# Patient Record
Sex: Female | Born: 1995 | Race: White | Hispanic: No | Marital: Single | Smoking: Never smoker
Health system: Southern US, Community
[De-identification: ages and names within clinical notes are randomized; demographics above are authoritative.]

## PROBLEM LIST (undated history)

## (undated) DIAGNOSIS — R011 Cardiac murmur, unspecified: Secondary | ICD-10-CM

## (undated) DIAGNOSIS — K589 Irritable bowel syndrome without diarrhea: Secondary | ICD-10-CM

## (undated) DIAGNOSIS — K219 Gastro-esophageal reflux disease without esophagitis: Secondary | ICD-10-CM

## (undated) DIAGNOSIS — F909 Attention-deficit hyperactivity disorder, unspecified type: Secondary | ICD-10-CM

## (undated) DIAGNOSIS — K760 Fatty (change of) liver, not elsewhere classified: Secondary | ICD-10-CM

## (undated) HISTORY — DX: Irritable bowel syndrome without diarrhea: K58.9

## (undated) HISTORY — DX: Cardiac murmur, unspecified: R01.1

## (undated) HISTORY — DX: Attention-deficit hyperactivity disorder, unspecified type: F90.9

## (undated) HISTORY — PX: OTHER SURGICAL HISTORY: SHX169

---
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
## 2017-01-01 ENCOUNTER — Encounter: Payer: Self-pay | Admitting: Obstetrics and Gynecology

## 2017-01-05 ENCOUNTER — Encounter: Payer: 59 | Admitting: Obstetrics and Gynecology

## 2017-01-29 ENCOUNTER — Ambulatory Visit (INDEPENDENT_AMBULATORY_CARE_PROVIDER_SITE_OTHER): Payer: 59 | Admitting: Obstetrics and Gynecology

## 2017-01-29 ENCOUNTER — Encounter: Payer: Self-pay | Admitting: Obstetrics and Gynecology

## 2017-01-29 VITALS — BP 128/84 | HR 88 | Ht 64.0 in | Wt 198.6 lb

## 2017-01-29 DIAGNOSIS — Z01419 Encounter for gynecological examination (general) (routine) without abnormal findings: Secondary | ICD-10-CM

## 2017-01-29 DIAGNOSIS — K580 Irritable bowel syndrome with diarrhea: Secondary | ICD-10-CM | POA: Diagnosis not present

## 2017-01-29 DIAGNOSIS — Z23 Encounter for immunization: Secondary | ICD-10-CM

## 2017-01-29 MED ORDER — DICYCLOMINE HCL 10 MG PO CAPS: 10.0000 mg | ORAL_CAPSULE | Freq: Three times a day (TID) | ORAL | 4 refills | Status: DC

## 2017-01-29 MED ORDER — ETONOGESTREL-ETHINYL ESTRADIOL 0.12-0.015 MG/24HR VA RING: VAGINAL_RING | VAGINAL | 12 refills | Status: DC

## 2017-01-29 NOTE — Progress Notes (Signed)
  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 female and is here for a comprehensive physical exam. The patient reports considering other birth control options; has trouble remembering to take [facility] on time.  also has diarrhea after eating each meal for the last few months..  Social History   Socioeconomic History  . Marital status: Single    Spouse name: Not on file  . Number of children: Not on file  . Years of education: Not on file  . Highest education level: Not on file  Social Needs  . Financial resource strain: Not on file  . Food insecurity - worry: Not on file  . Food insecurity - inability: Not on file  . Transportation needs - medical: Not on file  . Transportation needs - non-medical: Not on file  Occupational History  . Not on file  Tobacco Use  . Smoking status: Never Smoker  . Smokeless tobacco: Never Used  Substance and Sexual Activity  . Alcohol use: Yes    Alcohol/week: 0.6 - 1.2 oz    Types: 1 - 2 Standard drinks or equivalent per week  . Drug use: No  . Sexual activity: Yes    Partners: Female    Birth control/protection: Condom, Pill  Other Topics Concern  . Not on file  Social History Narrative  . Not on file   Health Maintenance  Topic Date Due  . HIV Screening  05/31/2012  . TETANUS/TDAP  05/31/2016  . INFLUENZA VACCINE  10/01/2016  . CHLAMYDIA SCREENING  12/10/2016    The following portions of the patient's history were reviewed and updated as appropriate: allergies, current medications, past family history, past medical history, past social history, past surgical history and problem list.  Review of Systems A comprehensive review of systems was negative.   Objective:    General appearance: alert, cooperative and appears stated age Neck: no adenopathy, no carotid bruit, no JVD, supple, symmetrical, trachea midline and thyroid not enlarged, symmetric, no  tenderness/mass/nodules Lungs: clear to auscultation bilaterally Breasts: normal appearance, no masses or tenderness Heart: regular rate and rhythm, S1, S2 normal, no murmur, click, rub or gallop Abdomen: soft, non-tender; bowel sounds normal; no masses,  no organomegaly Pelvic: cervix normal in appearance, external genitalia normal, no adnexal masses or tenderness, no cervical motion tenderness, rectovaginal septum normal, uterus normal size, shape, and consistency and vagina normal without discharge    Assessment:    Healthy female exam. New onset IBS, switch BC to Nuvaring     Plan:  Referred to GI, started on Bentyl  Nuvaring counseling and info given, to place first ring as soon as hse gets prescription. RTC 1 year or as needed.  Malanie Koloski,CNM   See After Visit Summary for Counseling Recommendations

## 2017-01-29 NOTE — Patient Instructions (Addendum)
Preventive Care 18-39 Years, Female Preventive care refers to lifestyle choices and visits with your health care provider that can promote health and wellness. What does preventive care include?  A yearly physical exam. This is also called an annual well check.  Dental exams once or twice a year.  Routine eye exams. Ask your health care provider how often you should have your eyes checked.  Personal lifestyle choices, including: ? Daily care of your teeth and gums. ? Regular physical activity. ? Eating a healthy diet. ? Avoiding tobacco and drug use. ? Limiting alcohol use. ? Practicing safe sex. ? Taking vitamin and mineral supplements as recommended by your health care provider. What happens during an annual well check? The services and screenings done by your health care provider during your annual well check will depend on your age, overall health, lifestyle risk factors, and family history of disease. Counseling Your health care provider may ask you questions about your:  Alcohol use.  Tobacco use.  Drug use.  Emotional well-being.  Home and relationship well-being.  Sexual activity.  Eating habits.  Work and work Statistician.  Method of birth control.  Menstrual cycle.  Pregnancy history.  Screening You may have the following tests or measurements:  Height, weight, and BMI.  Diabetes screening. This is done by checking your blood sugar (glucose) after you have not eaten for a while (fasting).  Blood pressure.  Lipid and cholesterol levels. These may be checked every 5 years starting at age 38.  Skin check.  Hepatitis C blood test.  Hepatitis B blood test.  Sexually transmitted disease (STD) testing.  BRCA-related cancer screening. This may be done if you have a family history of breast, ovarian, tubal, or peritoneal cancers.  Pelvic exam and Pap test. This may be done every 3 years starting at age 38. Starting at age 30, this may be done  every 5 years if you have a Pap test in combination with an HPV test.  Discuss your test results, treatment options, and if necessary, the need for more tests with your health care provider. Vaccines Your health care provider may recommend certain vaccines, such as:  Influenza vaccine. This is recommended every year.  Tetanus, diphtheria, and acellular pertussis (Tdap, Td) vaccine. You may need a Td booster every 10 years.  Varicella vaccine. You may need this if you have not been vaccinated.  HPV vaccine. If you are 39 or younger, you may need three doses over 6 months.  Measles, mumps, and rubella (MMR) vaccine. You may need at least one dose of MMR. You may also need a second dose.  Pneumococcal 13-valent conjugate (PCV13) vaccine. You may need this if you have certain conditions and were not previously vaccinated.  Pneumococcal polysaccharide (PPSV23) vaccine. You may need one or two doses if you smoke cigarettes or if you have certain conditions.  Meningococcal vaccine. One dose is recommended if you are age 68-21 years and a first-year college student living in a residence hall, or if you have one of several medical conditions. You may also need additional booster doses.  Hepatitis A vaccine. You may need this if you have certain conditions or if you travel or work in places where you may be exposed to hepatitis A.  Hepatitis B vaccine. You may need this if you have certain conditions or if you travel or work in places where you may be exposed to hepatitis B.  Haemophilus influenzae type b (Hib) vaccine. You may need this  if you have certain risk factors.  Talk to your health care provider about which screenings and vaccines you need and how often you need them. This information is not intended to replace advice given to you by your health care provider. Make sure you discuss any questions you have with your health care provider. Document Released: 04/15/2001 Document Revised:  11/07/2015 Document Reviewed: 12/19/2014 Elsevier Interactive Patient Education  2017 [place]. Diet for Irritable Bowel Syndrome When you have irritable bowel syndrome (IBS), the foods you eat and your eating habits are very important. IBS may cause various symptoms, such as abdominal pain, constipation, or diarrhea. Choosing the right foods can help ease discomfort caused by these symptoms. Work with your health care provider and dietitian to find the best eating plan to help control your symptoms. What general guidelines do I need to follow?  Keep a food diary. This will help you identify foods that cause symptoms. Write down: ? What you eat and when. ? What symptoms you have. ? When symptoms occur in relation to your meals.  Avoid foods that cause symptoms. Talk with your dietitian about other ways to get the same nutrients that are in these foods.  Eat more foods that contain fiber. Take a fiber supplement if directed by your dietitian.  Eat your meals slowly, in a relaxed setting.  Aim to eat 5-6 small meals per day. Do not skip meals.  Drink enough fluids to keep your urine clear or pale yellow.  Ask your health care provider if you should take an over-the-counter probiotic during flare-ups to help restore healthy gut bacteria.  If you have cramping or diarrhea, try making your meals low in fat and high in carbohydrates. Examples of carbohydrates are pasta, rice, whole grain breads and cereals, fruits, and vegetables.  If dairy products cause your symptoms to flare up, try eating less of them. You might be able to handle yogurt better than other dairy products because it contains bacteria that help with digestion. What foods are not recommended? The following are some foods and drinks that may worsen your symptoms:  Fatty foods, such as [place] fries.  Milk products, such as cheese or ice cream.  Chocolate.  Alcohol.  Products with caffeine, such as  coffee.  Carbonated drinks, such as soda.  The items listed above may not be a complete list of foods and beverages to avoid. Contact your dietitian for more information. What foods are good sources of fiber? Your health care provider or dietitian may recommend that you eat more foods that contain fiber. Fiber can help reduce constipation and other IBS symptoms. Add foods with fiber to your diet a little at a time so that your body can get used to them. Too much fiber at once might cause gas and swelling of your abdomen. The following are some foods that are good sources of fiber:  Apples.  Peaches.  Pears.  Berries.  Figs.  Broccoli (raw).  Cabbage.  Carrots.  Raw peas.  Kidney beans.  Lima beans.  Whole grain bread.  Whole grain cereal.  Where to find more information: [employer] for Functional Gastrointestinal Disorders: www.iffgd.[employer] of Diabetes and Digestive and Kidney Diseases: NetworkAffair.co.za.aspx This information is not intended to replace advice given to you by your health care provider. Make sure you discuss any questions you have with your health care provider. Document Released: 05/10/2003 Document Revised: 07/26/2015 Document Reviewed: 05/20/2013 Elsevier Interactive Patient Education  2018 [employer].

## 2017-01-31 DIAGNOSIS — K589 Irritable bowel syndrome without diarrhea: Secondary | ICD-10-CM

## 2017-01-31 HISTORY — DX: Irritable bowel syndrome without diarrhea: K58.9

## 2017-01-31 LAB — GC/CHLAMYDIA PROBE AMP
CHLAMYDIA, DNA PROBE: NEGATIVE
Neisseria gonorrhoeae by PCR: NEGATIVE

## 2017-03-31 ENCOUNTER — Telehealth: Payer: Self-pay | Admitting: *Deleted

## 2017-03-31 NOTE — Telephone Encounter (Signed)
Spoke with pt

## 2017-03-31 NOTE — Telephone Encounter (Signed)
Patient called and states that she is on her third month of the nuva ring and she states she has not had cycle since she started the [facility]. Patient is wondering is that normal. Patient is requesting a call back . Her contact # is [phone]. Please advise. Thank you

## 2017-05-13 ENCOUNTER — Encounter: Payer: Self-pay | Admitting: Emergency Medicine

## 2017-05-13 ENCOUNTER — Other Ambulatory Visit: Payer: Self-pay

## 2017-05-13 ENCOUNTER — Ambulatory Visit
Admission: EM | Admit: 2017-05-13 | Discharge: 2017-05-13 | Disposition: A | Discharge status: Home / Self Care | Payer: 59 | Attending: Family Medicine | Admitting: Family Medicine

## 2017-05-13 DIAGNOSIS — R1013 Epigastric pain: Secondary | ICD-10-CM

## 2017-05-13 DIAGNOSIS — R1010 Upper abdominal pain, unspecified: Secondary | ICD-10-CM | POA: Diagnosis not present

## 2017-05-13 LAB — URINALYSIS, COMPLETE (UACMP) WITH MICROSCOPIC
BILIRUBIN URINE: NEGATIVE
Glucose, UA: NEGATIVE mg/dL
KETONES UR: NEGATIVE mg/dL
Nitrite: NEGATIVE
PH: 5 (ref 5.0–8.0)
Protein, ur: NEGATIVE mg/dL
SPECIFIC GRAVITY, URINE: 1.02 (ref 1.005–1.030)

## 2017-05-13 LAB — LIPASE, BLOOD: LIPASE: 22 U/L (ref 11–51)

## 2017-05-13 LAB — COMPREHENSIVE METABOLIC PANEL
ALT: 34 U/L (ref 14–54)
ANION GAP: 11 (ref 5–15)
AST: 30 U/L (ref 15–41)
Albumin: 3.8 g/dL (ref 3.5–5.0)
Alkaline Phosphatase: 62 U/L (ref 38–126)
BILIRUBIN TOTAL: 0.5 mg/dL (ref 0.3–1.2)
BUN: 13 mg/dL (ref 6–20)
CO2: 20 mmol/L — ABNORMAL LOW (ref 22–32)
Calcium: 9.1 mg/dL (ref 8.9–10.3)
Chloride: 104 mmol/L (ref 101–111)
Creatinine, Ser: 0.77 mg/dL (ref 0.44–1.00)
GFR calc non Af Amer: >60 mL/min (ref >60)
Glucose, Bld: 86 mg/dL (ref 65–99)
POTASSIUM: 3.5 mmol/L (ref 3.5–5.1)
Sodium: 135 mmol/L (ref 135–145)
TOTAL PROTEIN: 7.9 g/dL (ref 6.5–8.1)

## 2017-05-13 MED ORDER — GI COCKTAIL ~~LOC~~
30.0000 mL | Freq: Once | ORAL | Status: AC
  Administered 2017-05-13: 30 mL via ORAL

## 2017-05-13 NOTE — ED Triage Notes (Signed)
Patient in today c/o abdominal pain that radiates to her back x 2 months, worsened in the last week. Patient also states that she has pain when she takes in a deep breath. Patient states that food does make the pain worse. Patient denies fever.

## 2017-05-13 NOTE — ED Provider Notes (Signed)
[facility]    CSN: [id number] Arrival date & time: 05/13/17  1655     History   Chief Complaint Chief Complaint  Patient presents with  . Abdominal Pain    APPT    HPI Katrina Campos is a 22 y.o. female.   The history is provided by the patient.  Abdominal Pain  Pain location:  Epigastric and RUQ Pain quality: aching   Pain radiates to:  Back Pain severity:  Moderate Onset quality:  Sudden Duration:  6 weeks Timing:  Intermittent Progression:  Waxing and waning Chronicity:  New Context: not alcohol use, not awakening from sleep, not diet changes, not eating, not laxative use, not medication withdrawal, not previous surgeries, not recent illness, not recent sexual activity, not recent travel, not retching, not sick contacts, not suspicious food intake and not trauma   Relieved by:  None tried Worsened by:  Eating Ineffective treatments:  None tried Associated symptoms: belching and nausea   Associated symptoms: no anorexia, no chest pain, no chills, no constipation, no cough, no diarrhea, no dysuria, no fatigue, no fever, no flatus, no hematemesis, no hematochezia, no hematuria, no melena, no shortness of breath, no sore throat, no vaginal bleeding, no vaginal discharge and no vomiting   Risk factors: obesity   Risk factors: no alcohol abuse, no aspirin use, has not had multiple surgeries, no NSAID use, not pregnant and no recent hospitalization     Past Medical History:  Diagnosis Date  . Heart murmur     Patient Active Problem List   Diagnosis Date Noted  . Anxiety 06/08/2016  . Palpitations 04/28/2016  . Obesity 12/11/2015    Past Surgical History:  Procedure Laterality Date  . NO PAST SURGERIES    . tubes in ears      OB History    Gravida Para Term Preterm AB Living   0 0 0 0 0 0   SAB TAB Ectopic Multiple Live Births   0 0 0 0 0       Home Medications    Prior to Admission medications   Medication Sig Start Date End Date  Taking? Authorizing Provider  etonogestrel-ethinyl estradiol (NUVARING) 0.12-0.015 MG/24HR vaginal ring Insert vaginally and leave in place for 3 consecutive weeks, then remove for 1 week. 01/29/17  Yes Shambley, Melody N, CNM  dicyclomine (BENTYL) 10 MG capsule Take 1 capsule (10 mg total) by mouth 4 (four) times daily -  before meals and at bedtime. 01/29/17   Shambley, Melody N, CNM  TAYTULLA 1-20 MG-MCG(24) CAPS TAKE 1 CAPSULE BY MOUTH ONCE DAILY 09/29/16   Joylene Igo, CNM    Family History Family History  Problem Relation Age of Onset  . Diabetes Paternal Grandfather   . Hyperlipidemia Father   . Diabetes Father   . Heart disease Maternal Grandfather   . Sudden death Maternal Grandfather     Social History Social History   Tobacco Use  . Smoking status: Never Smoker  . Smokeless tobacco: Never Used  Substance Use Topics  . Alcohol use: Yes    Frequency: Never    Comment: socially  . Drug use: No     Allergies   Patient has no known allergies.   Review of Systems Review of Systems  Constitutional: Negative for chills, fatigue and fever.  HENT: Negative for sore throat.   Respiratory: Negative for cough and shortness of breath.   Cardiovascular: Negative for chest pain.  Gastrointestinal: Positive for abdominal pain  and nausea. Negative for anorexia, constipation, diarrhea, flatus, hematemesis, hematochezia, melena and vomiting.  Genitourinary: Negative for dysuria, hematuria, vaginal bleeding and vaginal discharge.     Physical Exam Triage Vital Signs ED Triage Vitals [05/13/17 1711]  Enc Vitals Group     BP 126/81     Pulse Rate 87     Resp 16     Temp 98 F (36.7 C)     Temp Source Oral     SpO2 100 %     Weight 195 lb (88.5 kg)     Height 5\' 4"  (1.626 m)     Head Circumference      Peak Flow      Pain Score 7     Pain Loc      Pain Edu?      Excl. in [place]?    No data found.  Updated Vital Signs BP 126/81 (BP Location: Left Arm)   Pulse  87   Temp 98 F (36.7 C) (Oral)   Resp 16   Ht 5\' 4"  (1.626 m)   Wt 195 lb (88.5 kg)   LMP 04/20/2017 (Approximate)   SpO2 100%   BMI 33.47 kg/m   Visual Acuity Right Eye Distance:   Left Eye Distance:   Bilateral Distance:    Right Eye Near:   Left Eye Near:    Bilateral Near:     Physical Exam  Constitutional: She appears well-developed and well-nourished. No distress.  HENT:  Head: Normocephalic and atraumatic.  Nose: No mucosal edema, rhinorrhea, nose lacerations, sinus tenderness, nasal deformity, septal deviation or nasal septal hematoma. No epistaxis.  No foreign bodies. Right sinus exhibits no maxillary sinus tenderness and no frontal sinus tenderness. Left sinus exhibits no maxillary sinus tenderness and no frontal sinus tenderness.  Mouth/Throat: Uvula is midline, oropharynx is clear and moist and mucous membranes are normal. No oropharyngeal exudate. No tonsillar exudate.  Eyes: Right eye exhibits no discharge. Left eye exhibits no discharge. No scleral icterus.  Neck: Normal range of motion. Neck supple. No thyromegaly present.  Cardiovascular: Normal rate, regular rhythm and normal heart sounds.  Pulmonary/Chest: Effort normal and breath sounds normal. No stridor. No respiratory distress. She has no wheezes. She has no rales.  Abdominal: Soft. Bowel sounds are normal. She exhibits no distension and no mass. There is no tenderness. There is no rebound and no guarding. No hernia.  Lymphadenopathy:    She has no cervical adenopathy.  Skin: She is not diaphoretic.  Nursing note and vitals reviewed.    UC Treatments / Results  Labs (all labs ordered are listed, but only abnormal results are displayed) Labs Reviewed  COMPREHENSIVE METABOLIC PANEL - Abnormal; Notable for the following components:      Result Value   CO2 20 (*)    All other components within normal limits  URINALYSIS, COMPLETE (UACMP) WITH MICROSCOPIC - Abnormal; Notable for the following  components:   Hgb urine dipstick MODERATE (*)    Leukocytes, UA MODERATE (*)    Squamous Epithelial / LPF 6-30 (*)    Bacteria, UA RARE (*)    All other components within normal limits  URINE CULTURE  LIPASE, BLOOD    EKG  EKG Interpretation None       Radiology No results found.  Procedures Procedures (including critical care time)  Medications Ordered in UC Medications  gi cocktail (Maalox,Lidocaine,Donnatal) (30 mLs Oral Given 05/13/17 1823)     Initial Impression / Assessment and Plan / UC  Course  I have reviewed the triage vital signs and the nursing notes.  Pertinent labs & imaging results that were available during my care of the patient were reviewed by me and considered in my medical decision making (see chart for details).       Final Clinical Impressions(s) / UC Diagnoses   Final diagnoses:  Dyspepsia  Pain of upper abdomen    ED Discharge Orders    None     1. Lab results and diagnosis reviewed with patient; given gi cocktail with improvement 2. Recommend supportive treatment with otc prilosec 3. Follow up with pcp 4. Follow-up prn if symptoms worsen or don't improve  Controlled Substance Prescriptions [place] Controlled Substance Registry consulted? Not Applicable   Norval Gable, MD 05/13/17 1950

## 2017-05-13 NOTE — ED Triage Notes (Signed)
Patient has been prescribed Bentyl to take prior to meals, but hasn't taken in the last month, due to it making her tired.

## 2017-05-13 NOTE — Discharge Instructions (Signed)
Prilosec or nexium once daily

## 2017-05-15 LAB — URINE CULTURE: Special Requests: NORMAL

## 2017-05-19 ENCOUNTER — Encounter: Payer: Self-pay | Admitting: Family Medicine

## 2017-05-19 ENCOUNTER — Ambulatory Visit: Payer: 59 | Admitting: Family Medicine

## 2017-05-19 VITALS — BP 128/78 | HR 78 | Ht 64.0 in | Wt 202.0 lb

## 2017-05-19 DIAGNOSIS — F419 Anxiety disorder, unspecified: Secondary | ICD-10-CM

## 2017-05-19 DIAGNOSIS — R1011 Right upper quadrant pain: Secondary | ICD-10-CM

## 2017-05-19 DIAGNOSIS — Z7689 Persons encountering health services in other specified circumstances: Secondary | ICD-10-CM | POA: Diagnosis not present

## 2017-05-19 DIAGNOSIS — F410 Panic disorder [episodic paroxysmal anxiety] without agoraphobia: Secondary | ICD-10-CM

## 2017-05-19 DIAGNOSIS — R1013 Epigastric pain: Secondary | ICD-10-CM | POA: Diagnosis not present

## 2017-05-19 MED ORDER — PANTOPRAZOLE SODIUM 40 MG PO TBEC: 40.0000 mg | DELAYED_RELEASE_TABLET | Freq: Every day | ORAL | 3 refills | Status: DC

## 2017-05-19 NOTE — Progress Notes (Signed)
Name: Katrina Campos   MRN: [id number]    DOB: 14-Dec-1995   Date:05/19/2017       Progress Note  Subjective  Chief Complaint  Chief Complaint  Patient presents with  . Establish Care  . Anxiety    Started in August of 2018. Getting worse. Knows we do not prescribe meds for anxiety. But wanted to discuss for possible referral. States when havign episodes she feels SOB, crying, and chest pain. Feels more like panic attacks. 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  . Abdominal Pain    Started last week. Seen UC. Was told by Dr Zenda Alpers to come up for PCP visit to get referral for GI doctor to have colonoscopy or endoscopy done. Says it could be stress related. Taking OTC Prilosec but feels like not helping much for this.     Patient present to establish care.   Anxiety  Presents for initial visit. Onset was 1 to 6 months ago (4 months). The problem has been unchanged ("not any better"). Symptoms include chest pain, decreased concentration, excessive worry, hyperventilation, irritability, nausea, nervous/anxious behavior, panic and shortness of breath. Patient reports no compulsions, confusion, depressed mood, dizziness, dry mouth, feeling of choking, impotence, insomnia, malaise, muscle tension, obsessions, palpitations, restlessness or suicidal ideas. Primary symptoms comment: cardiac evaluation. Symptoms occur most days. The severity of symptoms is moderate and interfering with daily activities. The symptoms are aggravated by work stress (and school). The quality of sleep is good.   There are no known risk factors.  Her past medical history is significant for arrhythmia. There is no history of anemia, CAD, CHF or hyperthyroidism. (Gyn trial IBS/bentyl) Past treatments include nothing. The treatment provided mild relief. Compliance with prior treatments has been good.  Abdominal Pain  This is a new problem. The current episode started more than 1 month  ago (6 wks). The onset quality is sudden. The problem occurs daily. The problem has been gradually worsening. The pain is located in the epigastric region (extends substerum). The pain is at a severity of 9/10. The pain is moderate. The quality of the pain is sharp and cramping (stabbing subcostal). The abdominal pain radiates to the back. Associated symptoms include belching, flatus and nausea. Pertinent negatives include no anorexia, arthralgias, constipation, diarrhea, dysuria, fever, frequency, headaches, hematochezia, hematuria, melena, myalgias, vomiting or weight loss. The pain is aggravated by eating and deep breathing. Relieved by: gi cocktail/slight prilosec. She has tried proton pump inhibitors for the symptoms. The treatment provided mild relief. There is no history of GERD, irritable bowel syndrome, PUD or ulcerative colitis. gyn trial IBS/bentyl    No problem-specific Assessment & Plan notes found for this encounter.   Past Medical History:  Diagnosis Date  . Heart murmur    Born with one  . IBS (irritable bowel syndrome) 01/2017    Past Surgical History:  Procedure Laterality Date  . tubes in ears      Family History  Problem Relation Age of Onset  . Diabetes Paternal Grandfather   . Hyperlipidemia Father   . Diabetes Father   . Heart disease Maternal Grandfather   . Sudden death Maternal Grandfather     Social History   Socioeconomic History  . Marital status: Single    Spouse name: Not on file  . Number of children: 0  . Years of education: Not on file  . Highest education level: Not on file  Social Needs  .  Financial resource strain: Not on file  . Food insecurity - worry: Not on file  . Food insecurity - inability: Not on file  . Transportation needs - medical: Not on file  . Transportation needs - non-medical: Not on file  Occupational History  . Not on file  Tobacco Use  . Smoking status: Never Smoker  . Smokeless tobacco: Never Used  Substance and  Sexual Activity  . Alcohol use: Yes    Frequency: Never    Comment: socially  . Drug use: No  . Sexual activity: Yes    Partners: Female    Birth control/protection: Condom, Pill  Other Topics Concern  . Not on file  Social History Narrative  . Not on file    No Known Allergies  Outpatient Medications Prior to Visit  Medication Sig Dispense Refill  . dicyclomine (BENTYL) 10 MG capsule Take 10 mg by mouth as needed (Makes sleepy but takes as needed for IBS.).    Marland Kitchen etonogestrel-ethinyl estradiol (NUVARING) 0.12-0.015 MG/24HR vaginal ring Insert vaginally and leave in place for 3 consecutive weeks, then remove for 1 week. 1 each 12  . omeprazole (PRILOSEC) 10 MG capsule Take 10 mg by mouth daily.    Marland Kitchen dicyclomine (BENTYL) 10 MG capsule Take 1 capsule (10 mg total) by mouth 4 (four) times daily -  before meals and at bedtime. (Patient not taking: Reported on 05/19/2017) 90 capsule 4  . TAYTULLA 1-20 MG-MCG(24) CAPS TAKE 1 CAPSULE BY MOUTH ONCE DAILY (Patient not taking: Reported on 05/19/2017) 28 capsule 4   No facility-administered medications prior to visit.     Review of Systems  Constitutional: Positive for irritability. Negative for chills, diaphoresis, fever, malaise/fatigue and weight loss.  HENT: Negative for ear discharge, ear pain and sore throat.   Eyes: Negative for blurred vision.  Respiratory: Positive for shortness of breath. Negative for cough, sputum production and wheezing.   Cardiovascular: Positive for chest pain. Negative for palpitations and leg swelling.  Gastrointestinal: Positive for abdominal pain, flatus and nausea. Negative for anorexia, blood in stool, constipation, diarrhea, heartburn, hematochezia, melena and vomiting.  Genitourinary: Negative for dysuria, frequency, hematuria, impotence and urgency.  Musculoskeletal: Positive for back pain. Negative for arthralgias, joint pain, myalgias and neck pain.  Skin: Negative for itching and rash.  Neurological:  Negative for dizziness, tingling, sensory change, focal weakness, weakness and headaches.  Endo/Heme/Allergies: Negative for environmental allergies and polydipsia. Does not bruise/bleed easily.  Psychiatric/Behavioral: Positive for decreased concentration. Negative for confusion, depression and suicidal ideas. The patient is nervous/anxious. The patient does not have insomnia.      Objective  Vitals:   05/19/17 1520  BP: 128/78  Pulse: 78  SpO2: 99%  Weight: 202 lb (91.6 kg)  Height: 5\' 4"  (1.626 m)    Physical Exam  Constitutional: She is well-developed, well-nourished, and in no distress. No distress.  HENT:  Head: Normocephalic and atraumatic.  Right Ear: External ear normal.  Left Ear: External ear normal.  Nose: Nose normal.  Mouth/Throat: Oropharynx is clear and moist.  Eyes: Conjunctivae and EOM are normal. Pupils are equal, round, and reactive to light. Right eye exhibits no discharge. Left eye exhibits no discharge.  Neck: Normal range of motion. Neck supple. No JVD present. No thyromegaly present.  Cardiovascular: Normal rate, regular rhythm, normal heart sounds and intact distal pulses. Exam reveals no gallop and no friction rub.  No murmur heard. Pulmonary/Chest: Effort normal and breath sounds normal. She has no wheezes. She has  no rales. She exhibits bony tenderness.  Abdominal: Soft. Bowel sounds are normal. She exhibits no distension and no mass. There is no hepatosplenomegaly. There is tenderness in the right upper quadrant and epigastric area. There is guarding. There is no rigidity and no rebound.  Musculoskeletal: Normal range of motion. She exhibits no edema.  Lymphadenopathy:    She has no cervical adenopathy.  Neurological: She is alert. She has normal reflexes.  Skin: Skin is warm and dry. She is not diaphoretic.  Psychiatric: Mood and affect normal.  Nursing note and vitals reviewed.     Assessment & Plan  Problem List Items Addressed This Visit       Other   Anxiety   Relevant Orders   Ambulatory referral to Psychiatry    Other Visit Diagnoses    Establishing care with new doctor, encounter for    -  Primary   Dyspepsia       Relevant Medications   pantoprazole (PROTONIX) 40 MG tablet   Other Relevant Orders   Ambulatory referral to Gastroenterology   Right upper quadrant abdominal pain       Relevant Orders   Ambulatory referral to Gastroenterology   US Abdomen Limited RUQ   Panic attacks       Relevant Orders   Ambulatory referral to Psychiatry      Meds ordered this encounter  Medications  . pantoprazole (PROTONIX) 40 MG tablet    Sig: Take 1 tablet (40 mg total) by mouth daily.    Dispense:  30 tablet    Refill:  3      Dr. Macon [facility]  05/19/17

## 2017-05-25 ENCOUNTER — Ambulatory Visit
Admission: RE | Admit: 2017-05-25 | Discharge: 2017-05-25 | Disposition: A | Discharge status: Home / Self Care | Payer: 59 | Source: Ambulatory Visit | Attending: Family Medicine | Admitting: Family Medicine

## 2017-05-25 DIAGNOSIS — R1011 Right upper quadrant pain: Secondary | ICD-10-CM | POA: Insufficient documentation

## 2017-05-25 DIAGNOSIS — K760 Fatty (change of) liver, not elsewhere classified: Secondary | ICD-10-CM | POA: Diagnosis not present

## 2017-05-29 ENCOUNTER — Other Ambulatory Visit: Payer: 59

## 2017-05-29 DIAGNOSIS — K760 Fatty (change of) liver, not elsewhere classified: Secondary | ICD-10-CM | POA: Diagnosis not present

## 2017-05-30 LAB — LIPID PANEL WITH LDL/HDL RATIO
CHOLESTEROL TOTAL: 167 mg/dL (ref 100–169)
HDL: 37 mg/dL — ABNORMAL LOW (ref >39)
LDL Calculated: 120 mg/dL — ABNORMAL HIGH (ref 0–109)
LDl/HDL Ratio: 3.2 ratio (ref 0.0–3.2)
Triglycerides: 50 mg/dL (ref 0–89)
VLDL CHOLESTEROL CAL: 10 mg/dL (ref 5–40)

## 2017-05-30 LAB — HEPATIC FUNCTION PANEL
ALT: 99 IU/L — AB (ref 0–32)
AST: 81 IU/L — AB (ref 0–40)
Albumin: 4.4 g/dL (ref 3.5–5.5)
Alkaline Phosphatase: 66 IU/L (ref 39–117)
BILIRUBIN, DIRECT: 0.14 mg/dL (ref 0.00–0.40)
Bilirubin Total: 0.5 mg/dL (ref 0.0–1.2)
Total Protein: 7.6 g/dL (ref 6.0–8.5)

## 2017-06-01 ENCOUNTER — Other Ambulatory Visit: Payer: Self-pay

## 2017-06-09 ENCOUNTER — Encounter: Payer: Self-pay | Admitting: Gastroenterology

## 2017-06-09 ENCOUNTER — Ambulatory Visit: Payer: 59 | Admitting: Gastroenterology

## 2017-06-09 VITALS — BP 114/65 | HR 71 | Temp 98.5°F | Resp 16 | Ht 64.0 in | Wt 194.2 lb

## 2017-06-09 DIAGNOSIS — R7989 Other specified abnormal findings of blood chemistry: Secondary | ICD-10-CM

## 2017-06-09 DIAGNOSIS — E6609 Other obesity due to excess calories: Secondary | ICD-10-CM | POA: Diagnosis not present

## 2017-06-09 DIAGNOSIS — Z6833 Body mass index (BMI) 33.0-33.9, adult: Secondary | ICD-10-CM | POA: Diagnosis not present

## 2017-06-09 DIAGNOSIS — R945 Abnormal results of liver function studies: Secondary | ICD-10-CM | POA: Diagnosis not present

## 2017-06-09 DIAGNOSIS — K760 Fatty (change of) liver, not elsewhere classified: Secondary | ICD-10-CM

## 2017-06-09 NOTE — Progress Notes (Signed)
Cephas Darby, MD [address]  [place]  [place], [place] [address]  Main: [phone]  Fax: [phone]    Gastroenterology Consultation  Referring Provider:     Juline Patch, MD Primary Care Physician:  Juline Patch, MD Primary Gastroenterologist:  Dr. Cephas Darby Reason for Consultation:     Elevated LFTs, and upper abdominal pain        HPI:   Katrina Campos is a 22 y.o. female referred by Dr. Juline Patch, MD  for consultation & management of and chronic upper abdominal pain and new onset of elevated LFTs. Patient reports approximately 3 months history of epigastric pain, associated with nausea, right upper quadrant pain, constant and no relation to food. She underwent ultrasound abdomen which revealed fatty liver, here was no evidence of cholelithiasis, normal caliber CBD. She was also found to have mildly elevated transaminases 2-3x ULN on 05/29/2017. Her last normal LFTs were on 05/13/2017. Patient has history of severe anxiety and stress which is currently under control and she thinks her symptoms are mostly related to it. Patient reports that her right upper quadrant and upper abdominal pain has subsided. She has gained weight recently, TSH is normal. She also reports history of palpitations secondary to anxiety and underwent echocardiogram and stress echocardiogram which were unremarkable. 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 She otherwise denies practicing unprotected sex, IV drug abuse, blood transfusions, recent sickness. She reports that she is immunized against hepatitis A and B. No known HIV. She does reports that she consumes diet soda, red meat, white bread regularly  She denies using herbal supplements, excess Tylenol use or NSAID use  NSAIDs: none  Antiplts/Anticoagulants/Anti thrombotics: none  GI Procedures: none She denies family history of liver disease or GI malignancy  Past Medical  History:  Diagnosis Date  . Heart murmur    Born with one  . IBS (irritable bowel syndrome) 01/2017    Past Surgical History:  Procedure Laterality Date  . tubes in ears       Current Outpatient Medications:  .  dicyclomine (BENTYL) 10 MG capsule, Take 10 mg by mouth as needed (Makes sleepy but takes as needed for IBS.)., Disp: , Rfl:  .  etonogestrel-ethinyl estradiol (NUVARING) 0.12-0.015 MG/24HR vaginal ring, Insert vaginally and leave in place for 3 consecutive weeks, then remove for 1 week., Disp: 1 each, Rfl: 12 .  pantoprazole (PROTONIX) 40 MG tablet, Take 1 tablet (40 mg total) by mouth daily., Disp: 30 tablet, Rfl: 3   Family History  Problem Relation Age of Onset  . Diabetes Paternal Grandfather   . Hyperlipidemia Father   . Diabetes Father   . Heart disease Maternal Grandfather   . Sudden death Maternal Grandfather      Social History   Tobacco Use  . Smoking status: Never Smoker  . Smokeless tobacco: Never Used  Substance Use Topics  . Alcohol use: Yes    Frequency: Never    Comment: socially  . Drug use: No    Allergies as of 06/09/2017  . (No Known Allergies)    Review of Systems:    All systems reviewed and negative except where noted in HPI.   Physical Exam:  BP 114/65   Pulse 71   Temp 98.5 F (36.9 C) (Oral)   Resp 16   Ht 5\' 4"  (1.626 m)   Wt 194 lb 3.2 oz (88.1 kg)  LMP 05/26/2017   SpO2 97%   BMI 33.33 kg/m  Patient's last menstrual period was 05/26/2017.  General:   Alert,  Well-developed, well-nourished, pleasant and cooperative in NAD Head:  Normocephalic and atraumatic. Eyes:  Sclera clear, no icterus.   Conjunctiva pink. Ears:  Normal auditory acuity. Nose:  No deformity, discharge, or lesions. Mouth:  No deformity or lesions,oropharynx pink & moist. Neck:  Supple; no masses or thyromegaly. Lungs:  Respirations even and unlabored.  Clear throughout to auscultation.   No wheezes, crackles, or rhonchi. No acute  distress. Heart:  Regular rate and rhythm; no murmurs, clicks, rubs, or gallops. Abdomen:  Normal bowel sounds. Soft, obese, mild epigastric tenderness, and non-distended without masses, hepatosplenomegaly or hernias noted.  No guarding or rebound tenderness.   Rectal: Not performed Msk:  Symmetrical without gross deformities. Good, equal movement & strength bilaterally. Pulses:  Normal pulses noted. Extremities:  No clubbing or edema.  No cyanosis. Neurologic:  Alert and oriented x3;  grossly normal neurologically. Skin:  Intact without significant lesions or rashes. No jaundice. Lymph Nodes:  No significant cervical adenopathy. Psych:  Alert and cooperative. Normal mood and affect.  Imaging Studies: reviewed  Assessment and Plan:   Katrina Campos is a 22 y.o. female with obesity, generalized anxiety is seen in consultation for epigastric pain, right upper quadrant pain and elevated LFTs  Epigastric/right upper quadrant pain: RUQ pain currently resolved On Protonix for epigastric pain Defer EGD at this time  Elevated LFTs, predominantly transaminases 2-3 x ULN - probably secondary to fatty liver - discussed with her about weight loss by dietary modification and exercise - recheck LFTs in 3 months, if persistently elevated recommend secondary liver disease workup   Follow up in 3 months   Cephas Darby, MD

## 2017-09-01 ENCOUNTER — Encounter: Payer: Self-pay | Admitting: Obstetrics and Gynecology

## 2017-09-11 ENCOUNTER — Encounter: Payer: Self-pay | Admitting: Gastroenterology

## 2017-09-11 ENCOUNTER — Ambulatory Visit: Payer: 59 | Admitting: Gastroenterology

## 2017-09-11 VITALS — BP 107/73 | HR 72 | Resp 17 | Ht 64.0 in | Wt 185.0 lb

## 2017-09-11 DIAGNOSIS — K30 Functional dyspepsia: Secondary | ICD-10-CM | POA: Diagnosis not present

## 2017-09-11 DIAGNOSIS — R945 Abnormal results of liver function studies: Secondary | ICD-10-CM | POA: Diagnosis not present

## 2017-09-11 DIAGNOSIS — R7989 Other specified abnormal findings of blood chemistry: Secondary | ICD-10-CM

## 2017-09-11 DIAGNOSIS — K760 Fatty (change of) liver, not elsewhere classified: Secondary | ICD-10-CM | POA: Diagnosis not present

## 2017-09-11 NOTE — Progress Notes (Signed)
Cephas Darby, MD [address]  [place]  [place], [place] [address]  Main: [phone]  Fax: [phone]    Gastroenterology Consultation  Referring Provider:     Juline Patch, MD Primary Care Physician:  Juline Patch, MD Primary Gastroenterologist:  Dr. Cephas Darby Reason for Consultation:     Elevated LFTs, and upper abdominal pain        HPI:   Katrina Campos is a 22 y.o. female referred by Dr. Juline Patch, MD  for consultation & management of and chronic upper abdominal pain and new onset of elevated LFTs. Patient reports approximately 3 months history of epigastric pain, associated with nausea, right upper quadrant pain, constant and no relation to food. She underwent ultrasound abdomen which revealed fatty liver, here was no evidence of cholelithiasis, normal caliber CBD. She was also found to have mildly elevated transaminases 2-3x ULN on 05/29/2017. Her last normal LFTs were on 05/13/2017. Patient has history of severe anxiety and stress which is currently under control and she thinks her symptoms are mostly related to it. Patient reports that her right upper quadrant and upper abdominal pain has subsided. She has gained weight recently, TSH is normal. She also reports history of palpitations secondary to anxiety and underwent echocardiogram and stress echocardiogram which were unremarkable. 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 She otherwise denies practicing unprotected sex, IV drug abuse, blood transfusions, recent sickness. She reports that she is immunized against hepatitis A and B. No known HIV. She does reports that she consumes diet soda, red meat, white bread regularly  She denies using herbal supplements, excess Tylenol use or NSAID use  Follow-up visit 09/11/2017 She lost about 9 pounds since last visit by following healthy diet and exercise. She is not consuming red meat, fast foods. She denies any  GI symptoms that she was experiencing before. She feels well overall. She is taking Bentyl as needed and Protonix daily  NSAIDs: none  Antiplts/Anticoagulants/Anti thrombotics: none  GI Procedures: none She denies family history of liver disease or GI malignancy  Past Medical History:  Diagnosis Date  . Heart murmur    Born with one  . IBS (irritable bowel syndrome) 01/2017    Past Surgical History:  Procedure Laterality Date  . tubes in ears       Current Outpatient Medications:  .  dicyclomine (BENTYL) 10 MG capsule, Take 10 mg by mouth as needed (Makes sleepy but takes as needed for IBS.)., Disp: , Rfl:  .  etonogestrel-ethinyl estradiol (NUVARING) 0.12-0.015 MG/24HR vaginal ring, Insert vaginally and leave in place for 3 consecutive weeks, then remove for 1 week., Disp: 1 each, Rfl: 12 .  pantoprazole (PROTONIX) 40 MG tablet, Take 1 tablet (40 mg total) by mouth daily., Disp: 30 tablet, Rfl: 3   Family History  Problem Relation Age of Onset  . Diabetes Paternal Grandfather   . Hyperlipidemia Father   . Diabetes Father   . Heart disease Maternal Grandfather   . Sudden death Maternal Grandfather      Social History   Tobacco Use  . Smoking status: Never Smoker  . Smokeless tobacco: Never Used  Substance Use Topics  . Alcohol use: Yes    Frequency: Never    Comment: socially  . Drug use: No    Allergies as of 09/11/2017  . (No Known Allergies)    Review of Systems:  All systems reviewed and negative except where noted in HPI.   Physical Exam:  BP 107/73 (BP Location: Left Arm, Patient Position: Sitting, Cuff Size: Large)   Pulse 72   Resp 17   Ht 5\' 4"  (1.626 m)   Wt 185 lb (83.9 kg)   BMI 31.76 kg/m  No LMP recorded.  General:   Alert,  Well-developed, well-nourished, pleasant and cooperative in NAD Head:  Normocephalic and atraumatic. Eyes:  Sclera clear, no icterus.   Conjunctiva pink. Ears:  Normal auditory acuity. Nose:  No deformity,  discharge, or lesions. Mouth:  No deformity or lesions,oropharynx pink & moist. Neck:  Supple; no masses or thyromegaly. Lungs:  Respirations even and unlabored.  Clear throughout to auscultation.   No wheezes, crackles, or rhonchi. No acute distress. Heart:  Regular rate and rhythm; no murmurs, clicks, rubs, or gallops. Abdomen:  Normal bowel sounds. Soft, nontender, and non-distended without masses, hepatosplenomegaly or hernias noted.  No guarding or rebound tenderness.   Rectal: Not performed Msk:  Symmetrical without gross deformities. Good, equal movement & strength bilaterally. Pulses:  Normal pulses noted. Extremities:  No clubbing or edema.  No cyanosis. Neurologic:  Alert and oriented x3;  grossly normal neurologically. Skin:  Intact without significant lesions or rashes. No jaundice. Psych:  Alert and cooperative. Normal mood and affect.  Imaging Studies: reviewed  Assessment and Plan:   Katrina Campos is a 22 y.o. female with obesity, generalized anxiety is seen for follow-up of epigastric pain, right upper quadrant pain and elevated LFTs. She is currently asymptomatic  Epigastric/right upper quadrant pain: RUQ pain currently resolved On Protonix for epigastric pain Defer EGD at this time  Elevated LFTs, predominantly transaminases 2-3 x ULN - probably secondary to fatty liver - lost about 9 pounds by following healthy lifestyle and exercise - recheck LFTs today, if persistently elevated recommend secondary liver disease workup   Follow up in 6 months   Cephas Darby, MD

## 2017-09-24 ENCOUNTER — Encounter: Payer: Self-pay | Admitting: Obstetrics and Gynecology

## 2017-10-01 ENCOUNTER — Encounter: Payer: Self-pay | Admitting: Obstetrics and Gynecology

## 2017-10-15 ENCOUNTER — Encounter: Payer: Self-pay | Admitting: Family Medicine

## 2017-10-15 ENCOUNTER — Ambulatory Visit: Payer: 59 | Admitting: Family Medicine

## 2017-10-15 VITALS — BP 118/79 | HR 102 | Temp 101.7°F | Resp 16 | Ht 64.0 in | Wt 184.4 lb

## 2017-10-15 DIAGNOSIS — J029 Acute pharyngitis, unspecified: Secondary | ICD-10-CM

## 2017-10-15 DIAGNOSIS — J0301 Acute recurrent streptococcal tonsillitis: Secondary | ICD-10-CM | POA: Diagnosis not present

## 2017-10-15 LAB — POCT RAPID STREP A (OFFICE): Rapid Strep A Screen: POSITIVE — AB

## 2017-10-15 MED ORDER — AMOXICILLIN-POT CLAVULANATE 875-125 MG PO TABS: 1.0000 | ORAL_TABLET | Freq: Two times a day (BID) | ORAL | 0 refills | Status: DC

## 2017-10-15 MED ORDER — PREDNISONE 10 MG PO TABS: 10.0000 mg | ORAL_TABLET | Freq: Every day | ORAL | 0 refills | Status: DC

## 2017-10-15 MED ORDER — TRAMADOL HCL 50 MG PO TABS: 50.0000 mg | ORAL_TABLET | Freq: Four times a day (QID) | ORAL | 0 refills | Status: AC

## 2017-10-15 MED ORDER — FLUCONAZOLE 150 MG PO TABS: 150.0000 mg | ORAL_TABLET | Freq: Once | ORAL | 0 refills | Status: AC

## 2017-10-15 NOTE — Progress Notes (Signed)
Name: Katrina Campos   MRN: [id number]    DOB: [birth year]   Date:10/15/2017       Progress Note  Subjective  Chief Complaint  Chief Complaint  Patient presents with  . Sore Throat    tonsils swollen and fever x 1 day with severe pain upon swallowing     Sore Throat   This is a new problem. The current episode started yesterday. The problem has been gradually worsening. The pain is worse on the right side. There has been no fever. The pain is severe. Associated symptoms include drooling, ear pain, a hoarse voice and swollen glands. Pertinent negatives include no abdominal pain, congestion, coughing, diarrhea, ear discharge, headaches, plugged ear sensation, neck pain, shortness of breath, stridor, trouble swallowing or vomiting. She has tried acetaminophen for the symptoms.    No problem-specific Assessment & Plan notes found for this encounter.   Past Medical History:  Diagnosis Date  . Heart murmur    Born with one  . IBS (irritable bowel syndrome) 01/2017    Past Surgical History:  Procedure Laterality Date  . tubes in ears      Family History  Problem Relation Age of Onset  . Diabetes Paternal Grandfather   . Hyperlipidemia Father   . Diabetes Father   . Heart disease Maternal Grandfather   . Sudden death Maternal Grandfather     Social History   Socioeconomic History  . Marital status: Single    Spouse name: Not on file  . Number of children: 0  . Years of education: Not on file  . Highest education level: Not on file  Occupational History  . Not on file  Social Needs  . Financial resource strain: Not on file  . Food insecurity:    Worry: Not on file    Inability: Not on file  . Transportation needs:    Medical: Not on file    Non-medical: Not on file  Tobacco Use  . Smoking status: Never Smoker  . Smokeless tobacco: Never Used  Substance and Sexual Activity  . Alcohol use: Yes    Frequency: Never    Comment: socially  . Drug use: No  . Sexual  activity: Yes    Partners: Female    Birth control/protection: Condom, Pill  Lifestyle  . Physical activity:    Days per week: Not on file    Minutes per session: Not on file  . Stress: Not on file  Relationships  . Social connections:    Talks on phone: Not on file    Gets together: Not on file    Attends religious service: Not on file    Active member of club or organization: Not on file    Attends meetings of clubs or organizations: Not on file    Relationship status: Not on file  . Intimate partner violence:    Fear of current or ex partner: Not on file    Emotionally abused: Not on file    Physically abused: Not on file    Forced sexual activity: Not on file  Other Topics Concern  . Not on file  Social History Narrative  . Not on file    No Known Allergies  Outpatient Medications Prior to Visit  Medication Sig Dispense Refill  . dicyclomine (BENTYL) 10 MG capsule Take 10 mg by mouth as needed (Makes sleepy but takes as needed for IBS.).    Marland Kitchen etonogestrel-ethinyl estradiol (NUVARING) 0.12-0.015 MG/24HR vaginal ring Insert vaginally and  leave in place for 3 consecutive weeks, then remove for 1 week. 1 each 12  . ibuprofen (ADVIL,MOTRIN) 200 MG tablet Take 200 mg by mouth every 4 (four) hours.    . pantoprazole (PROTONIX) 40 MG tablet Take 1 tablet (40 mg total) by mouth daily. 30 tablet 3   No facility-administered medications prior to visit.     Review of Systems  Constitutional: Negative for chills, fever, malaise/fatigue and weight loss.  HENT: Positive for drooling, ear pain and hoarse voice. Negative for congestion, ear discharge, sore throat and trouble swallowing.   Eyes: Negative for blurred vision.  Respiratory: Negative for cough, sputum production, shortness of breath, wheezing and stridor.   Cardiovascular: Negative for chest pain, palpitations and leg swelling.  Gastrointestinal: Negative for abdominal pain, blood in stool, constipation, diarrhea,  heartburn, melena, nausea and vomiting.  Genitourinary: Negative for dysuria, frequency, hematuria and urgency.  Musculoskeletal: Negative for back pain, joint pain, myalgias and neck pain.  Skin: Negative for rash.  Neurological: Negative for dizziness, tingling, sensory change, focal weakness and headaches.  Endo/Heme/Allergies: Negative for environmental allergies and polydipsia. Does not bruise/bleed easily.  Psychiatric/Behavioral: Negative for depression and suicidal ideas. The patient is not nervous/anxious and does not have insomnia.      Objective  Vitals:   10/15/17 1546  BP: 118/79  Pulse: (!) 102  Resp: 16  Temp: (!) 101.7 F (38.7 C)  TempSrc: Oral  SpO2: 100%  Weight: 184 lb 6.4 oz (83.6 kg)  Height: 5\' 4"  (1.626 m)    Physical Exam  Constitutional: She is oriented to person, place, and time. She appears well-developed and well-nourished.  HENT:  Head: Normocephalic.  Right Ear: Hearing, tympanic membrane, external ear and ear canal normal.  Left Ear: Hearing, tympanic membrane, external ear and ear canal normal.  Nose: Nose normal.  Mouth/Throat: Uvula is midline and mucous membranes are normal. Mucous membranes are not cyanotic. No oral lesions. No trismus in the jaw. No uvula swelling. Oropharyngeal exudate, posterior oropharyngeal edema and posterior oropharyngeal erythema present. No tonsillar abscesses. Tonsils are 2+ on the right. Tonsils are 2+ on the left. Tonsillar exudate.  Eyes: Pupils are equal, round, and reactive to light. Conjunctivae and EOM are normal. Lids are everted and swept, no foreign bodies found. Left eye exhibits no hordeolum. No foreign body present in the left eye. Right conjunctiva is not injected. Left conjunctiva is not injected. No scleral icterus.  Neck: Normal range of motion. Neck supple. Normal carotid pulses, no hepatojugular reflux and no JVD present. No tracheal tenderness present. Carotid bruit is not present. No tracheal  deviation and no edema present. No thyroid mass and no thyromegaly present.  Cardiovascular: Normal rate, regular rhythm, normal heart sounds and intact distal pulses. Exam reveals no gallop and no friction rub.  No murmur heard. Pulmonary/Chest: Effort normal and breath sounds normal. No respiratory distress. She has no wheezes. She has no rales. She exhibits tenderness.  Abdominal: Soft. Bowel sounds are normal. She exhibits no mass. There is no hepatosplenomegaly. There is no tenderness. There is no rebound and no guarding.  Musculoskeletal: Normal range of motion. She exhibits no edema or tenderness.  Lymphadenopathy:       Head (right side): Submandibular adenopathy present.       Head (left side): Submandibular adenopathy present.    She has no cervical adenopathy.  Neurological: She is alert and oriented to person, place, and time. She has normal strength. She displays normal reflexes. No cranial  nerve deficit.  Skin: Skin is warm. No rash noted.  Psychiatric: She has a normal mood and affect. Her mood appears not anxious. She does not exhibit a depressed mood.  Nursing note and vitals reviewed.     Assessment & Plan  Problem List Items Addressed This Visit    None    Visit Diagnoses    Sore throat    -  Primary   Simular to March/strep throat/ check rapid strep.   Relevant Orders   POCT rapid strep A (Completed)   Ambulatory referral to ENT   Recurrent streptococcal tonsillitis       Acute Recurrent Will treat augmentin 875 bid 10 days.prednisone 10 mg daily/ diflucan 150mg  prn/ tramadol 50mg  for pain. Referral ent /consider tonsilectomy.   Relevant Medications   amoxicillin-clavulanate (AUGMENTIN) 875-125 MG tablet   fluconazole (DIFLUCAN) 150 MG tablet   Other Relevant Orders   Ambulatory referral to ENT      Meds ordered this encounter  Medications  . amoxicillin-clavulanate (AUGMENTIN) 875-125 MG tablet    Sig: Take 1 tablet by mouth 2 (two) times daily.     Dispense:  20 tablet    Refill:  0  . predniSONE (DELTASONE) 10 MG tablet    Sig: Take 1 tablet (10 mg total) by mouth daily with breakfast.    Dispense:  30 tablet    Refill:  0  . fluconazole (DIFLUCAN) 150 MG tablet    Sig: Take 1 tablet (150 mg total) by mouth once for 1 dose.    Dispense:  1 tablet    Refill:  0  . traMADol (ULTRAM) 50 MG tablet    Sig: Take 1 tablet (50 mg total) by mouth 4 (four) times daily for 3 days.    Dispense:  12 tablet    Refill:  0      Dr. Macon [facility]  10/15/17

## 2017-10-28 ENCOUNTER — Ambulatory Visit: Payer: 59

## 2017-11-03 ENCOUNTER — Ambulatory Visit (INDEPENDENT_AMBULATORY_CARE_PROVIDER_SITE_OTHER): Payer: 59 | Admitting: Family Medicine

## 2017-11-03 VITALS — BP 120/80 | HR 72 | Ht 64.0 in | Wt 185.0 lb

## 2017-11-03 DIAGNOSIS — Z23 Encounter for immunization: Secondary | ICD-10-CM | POA: Diagnosis not present

## 2017-11-03 DIAGNOSIS — D2272 Melanocytic nevi of left lower limb, including hip: Secondary | ICD-10-CM

## 2017-11-03 DIAGNOSIS — J0391 Acute recurrent tonsillitis, unspecified: Secondary | ICD-10-CM | POA: Diagnosis not present

## 2017-11-03 DIAGNOSIS — A184 Tuberculosis of skin and subcutaneous tissue: Secondary | ICD-10-CM

## 2017-11-03 NOTE — Progress Notes (Signed)
Name: Katrina Campos   MRN: [id number]    DOB: 1995/08/01   Date:11/03/2017       Progress Note  Subjective  Chief Complaint  Chief Complaint  Patient presents with  . PPD Reading    get tb skin test placed    HPI  No problem-specific Assessment & Plan notes found for this encounter.   Past Medical History:  Diagnosis Date  . Heart murmur    Born with one  . IBS (irritable bowel syndrome) 01/2017    Past Surgical History:  Procedure Laterality Date  . tubes in ears      Family History  Problem Relation Age of Onset  . Diabetes Paternal Grandfather   . Hyperlipidemia Father   . Diabetes Father   . Heart disease Maternal Grandfather   . Sudden death Maternal Grandfather     Social History   Socioeconomic History  . Marital status: Single    Spouse name: Not on file  . Number of children: 0  . Years of education: Not on file  . Highest education level: Not on file  Occupational History  . Not on file  Social Needs  . Financial resource strain: Not on file  . Food insecurity:    Worry: Not on file    Inability: Not on file  . Transportation needs:    Medical: Not on file    Non-medical: Not on file  Tobacco Use  . Smoking status: Never Smoker  . Smokeless tobacco: Never Used  Substance and Sexual Activity  . Alcohol use: Yes    Frequency: Never    Comment: socially  . Drug use: No  . Sexual activity: Yes    Partners: Female    Birth control/protection: Condom, Pill  Lifestyle  . Physical activity:    Days per week: Not on file    Minutes per session: Not on file  . Stress: Not on file  Relationships  . Social connections:    Talks on phone: Not on file    Gets together: Not on file    Attends religious service: Not on file    Active member of club or organization: Not on file    Attends meetings of clubs or organizations: Not on file    Relationship status: Not on file  . Intimate partner violence:    Fear of current or ex partner: Not on  file    Emotionally abused: Not on file    Physically abused: Not on file    Forced sexual activity: Not on file  Other Topics Concern  . Not on file  Social History Narrative  . Not on file    No Known Allergies  Outpatient Medications Prior to Visit  Medication Sig Dispense Refill  . dicyclomine (BENTYL) 10 MG capsule Take 10 mg by mouth as needed (Makes sleepy but takes as needed for IBS.).    Marland Kitchen etonogestrel-ethinyl estradiol (NUVARING) 0.12-0.015 MG/24HR vaginal ring Insert vaginally and leave in place for 3 consecutive weeks, then remove for 1 week. 1 each 12  . ibuprofen (ADVIL,MOTRIN) 200 MG tablet Take 200 mg by mouth every 4 (four) hours.    . pantoprazole (PROTONIX) 40 MG tablet Take 1 tablet (40 mg total) by mouth daily. 30 tablet 3  . amoxicillin-clavulanate (AUGMENTIN) 875-125 MG tablet Take 1 tablet by mouth 2 (two) times daily. 20 tablet 0  . predniSONE (DELTASONE) 10 MG tablet Take 1 tablet (10 mg total) by mouth daily with breakfast. 30  tablet 0   No facility-administered medications prior to visit.     Review of Systems  Constitutional: Negative for chills, fever, malaise/fatigue and weight loss.  HENT: Negative for ear discharge, ear pain and sore throat.   Eyes: Negative for blurred vision.  Respiratory: Negative for cough, sputum production, shortness of breath and wheezing.   Cardiovascular: Negative for chest pain, palpitations and leg swelling.  Gastrointestinal: Negative for abdominal pain, blood in stool, constipation, diarrhea, heartburn, melena and nausea.  Genitourinary: Negative for dysuria, frequency, hematuria and urgency.  Musculoskeletal: Negative for back pain, joint pain, myalgias and neck pain.  Skin: Negative for rash.  Neurological: Negative for dizziness, tingling, sensory change, focal weakness and headaches.  Endo/Heme/Allergies: Negative for environmental allergies and polydipsia. Does not bruise/bleed easily.  Psychiatric/Behavioral:  Negative for depression and suicidal ideas. The patient is not nervous/anxious and does not have insomnia.      Objective  Vitals:   11/03/17 1457  BP: 120/80  Pulse: 72  Weight: 185 lb (83.9 kg)  Height: 5\' 4"  (1.626 m)    Physical Exam  Constitutional: She is oriented to person, place, and time. She appears well-developed and well-nourished.  HENT:  Head: Normocephalic.  Right Ear: External ear normal.  Left Ear: External ear normal.  Mouth/Throat: Oropharynx is clear and moist.  Eyes: Pupils are equal, round, and reactive to light. Conjunctivae and EOM are normal. Lids are everted and swept, no foreign bodies found. Left eye exhibits no hordeolum. No foreign body present in the left eye. Right conjunctiva is not injected. Left conjunctiva is not injected. No scleral icterus.  Neck: Normal range of motion. Neck supple. No JVD present. No tracheal deviation present. No thyromegaly present.  Cardiovascular: Normal rate, regular rhythm, normal heart sounds and intact distal pulses. Exam reveals no gallop and no friction rub.  No murmur heard. Pulmonary/Chest: Effort normal and breath sounds normal. No respiratory distress. She has no wheezes. She has no rales.  Abdominal: Soft. Bowel sounds are normal. She exhibits no mass. There is no hepatosplenomegaly. There is no tenderness. There is no rebound and no guarding.  Musculoskeletal: Normal range of motion. She exhibits no edema or tenderness.  Lymphadenopathy:    She has no cervical adenopathy.  Neurological: She is alert and oriented to person, place, and time. She has normal strength. She displays normal reflexes. No cranial nerve deficit.  Skin: Skin is warm. No rash noted.  Maculopapular pigmented nevus  Psychiatric: She has a normal mood and affect. Her mood appears not anxious. She does not exhibit a depressed mood.  Nursing note and vitals reviewed.     Assessment & Plan  Problem List Items Addressed This Visit    None     Visit Diagnoses    Recurrent tonsillitis    -  Primary   check on referral from 10/15/17- proceed   Relevant Orders   Ambulatory referral to ENT   Melanocytic nevus of left lower extremity       referral to dermatology   Relevant Orders   Ambulatory referral to Dermatology   Need for diphtheria-tetanus-pertussis (Tdap) vaccine       TDAP given   Relevant Orders   Tdap vaccine greater than or equal to 7yo IM (Completed)   TB skin/subcutaneous       tb administered   Relevant Orders   TB Skin Test (Completed)      No orders of the defined types were placed in this encounter.  Dr. [facility]  11/03/17

## 2017-11-05 LAB — TB SKIN TEST
Induration: 0 mm
TB Skin Test: NEGATIVE

## 2017-11-12 DIAGNOSIS — J3501 Chronic tonsillitis: Secondary | ICD-10-CM | POA: Diagnosis not present

## 2017-11-12 DIAGNOSIS — J351 Hypertrophy of tonsils: Secondary | ICD-10-CM | POA: Diagnosis not present

## 2017-11-17 ENCOUNTER — Ambulatory Visit (INDEPENDENT_AMBULATORY_CARE_PROVIDER_SITE_OTHER): Payer: 59

## 2017-11-17 DIAGNOSIS — A184 Tuberculosis of skin and subcutaneous tissue: Secondary | ICD-10-CM

## 2017-11-23 LAB — TB SKIN TEST
INDURATION: 0 mm
TB SKIN TEST: NEGATIVE

## 2017-11-24 DIAGNOSIS — D485 Neoplasm of uncertain behavior of skin: Secondary | ICD-10-CM | POA: Diagnosis not present

## 2017-11-24 DIAGNOSIS — L700 Acne vulgaris: Secondary | ICD-10-CM | POA: Diagnosis not present

## 2017-11-24 DIAGNOSIS — D2272 Melanocytic nevi of left lower limb, including hip: Secondary | ICD-10-CM | POA: Diagnosis not present

## 2017-11-24 DIAGNOSIS — Z79899 Other long term (current) drug therapy: Secondary | ICD-10-CM | POA: Diagnosis not present

## 2017-11-26 DIAGNOSIS — L700 Acne vulgaris: Secondary | ICD-10-CM | POA: Diagnosis not present

## 2017-12-07 ENCOUNTER — Ambulatory Visit (INDEPENDENT_AMBULATORY_CARE_PROVIDER_SITE_OTHER): Payer: 59

## 2017-12-07 DIAGNOSIS — Z23 Encounter for immunization: Secondary | ICD-10-CM

## 2017-12-07 DIAGNOSIS — D485 Neoplasm of uncertain behavior of skin: Secondary | ICD-10-CM | POA: Diagnosis not present

## 2017-12-24 DIAGNOSIS — L700 Acne vulgaris: Secondary | ICD-10-CM | POA: Diagnosis not present

## 2017-12-24 DIAGNOSIS — Z79899 Other long term (current) drug therapy: Secondary | ICD-10-CM | POA: Diagnosis not present

## 2018-01-11 DIAGNOSIS — J3501 Chronic tonsillitis: Secondary | ICD-10-CM | POA: Diagnosis not present

## 2018-01-11 DIAGNOSIS — J351 Hypertrophy of tonsils: Secondary | ICD-10-CM | POA: Diagnosis not present

## 2018-01-14 ENCOUNTER — Other Ambulatory Visit: Payer: Self-pay

## 2018-01-14 ENCOUNTER — Encounter: Payer: Self-pay | Admitting: *Deleted

## 2018-01-14 NOTE — Discharge Instructions (Signed)
T & A INSTRUCTION SHEET - [facility] °[facility] ° °CREIGHTON VAUGHT, MD °PAUL H. JUENGEL, MD  °P. SCOTT BENNETT °CHAPMAN MCQUEEN, MD ° °[address] [place], [place] [address] TEL. [phone] °[address] [place] [address] [phone] ° °INFORMATION SHEET FOR A TONSILLECTOMY AND ADENDOIDECTOMY ° °About Your Tonsils and Adenoids ° The tonsils and adenoids are normal body tissues that are part of our immune system.  They normally help to protect us against diseases that may enter our mouth and nose.  However, sometimes the tonsils and/or adenoids become too large and obstruct our breathing, especially at night. °  ° If either of these things happen it helps to remove the tonsils and adenoids in order to become healthier. The operation to remove the tonsils and adenoids is called a tonsillectomy and adenoidectomy. ° °The Location of Your Tonsils and Adenoids ° The tonsils are located in the back of the throat on both side and sit in a cradle of muscles. The adenoids are located in the roof of the mouth, behind the nose, and closely associated with the opening of the Eustachian tube to the ear. ° °Surgery on Tonsils and Adenoids ° A tonsillectomy and adenoidectomy is a short operation which takes about thirty minutes.  This includes being put to sleep and being awakened.  Tonsillectomies and adenoidectomies are performed at [facility] and may require observation period in the recovery room prior to going home. ° °Following the Operation for a Tonsillectomy ° A cautery machine is used to control bleeding.  Bleeding from a tonsillectomy and adenoidectomy is minimal and postoperatively the risk of bleeding is approximately four percent, although this rarely life threatening. ° ° ° °After your tonsillectomy and adenoidectomy post-op care at home: ° °1. Our patients are able to go home the same day.  You may be given prescriptions for pain  medications and antibiotics, if indicated. °2. It is extremely important to remember that fluid intake is of utmost importance after a tonsillectomy.  The amount that you drink must be maintained in the postoperative period.  A good indication of whether a child is getting enough fluid is whether his/her urine output is constant.  As long as children are urinating or wetting their diaper every 6 - 8 hours this is usually enough fluid intake.   °3. Although rare, this is a risk of some bleeding in the first ten days after surgery.  This is usually occurs between day five and nine postoperatively.  This risk of bleeding is approximately four percent.  If you or your child should have any bleeding you should remain calm and notify our office or go directly to the Emergency Room at [facility] where they will contact us. Our doctors are available seven days a week for notification.  We recommend sitting up quietly in a chair, place an ice pack on the front of the neck and spitting out the blood gently until we are able to contact you.  Adults should gargle gently with ice water and this may help stop the bleeding.  If the bleeding does not stop after a short time, i.e. 10 to 15 minutes, or seems to be increasing again, please contact us or go to the hospital.   °4. It is common for the pain to be worse at 5 - 7 days postoperatively.  This occurs because the “scab” is peeling off and the mucous membrane (skin of   the throat) is growing back where the tonsils were.   5. It is common for a low-grade fever, less than 102, during the first week after a tonsillectomy and adenoidectomy.  It is usually due to not drinking enough liquids, and we suggest your use liquid Tylenol or the pain medicine with Tylenol prescribed in order to keep your temperature below 102.  Please follow the directions on the back of the bottle. 6. Do not take aspirin or any products that contain aspirin such as Bufferin, Anacin,  Ecotrin, aspirin gum, Goodies, BC headache powders, etc., after a T&A because it can promote bleeding.  Please check with our office before administering any other medication that may been prescribed by other doctors during the two week post-operative period. 7. If you happen to look in the mirror or into your childs mouth you will see white/gray patches on the back of the throat.  This is what a scab looks like in the mouth and is normal after having a T&A.  It will disappear once the tonsil area heals completely. However, it may cause a noticeable odor, and this too will disappear with time.     8. You or your child may experience ear pain after having a T&A.  This is called referred pain and comes from the throat, but it is felt in the ears.  Ear pain is quite common and expected.  It will usually go away after ten days.  There is usually nothing wrong with the ears, and it is primarily due to the healing area stimulating the nerve to the ear that runs along the side of the throat.  Use either the prescribed pain medicine or Tylenol as needed.  9. The throat tissues after a tonsillectomy are obviously sensitive.  Smoking around children who have had a tonsillectomy significantly increases the risk of bleeding.  DO NOT SMOKE!   General Anesthesia, Adult, Care After These instructions provide you with information about caring for yourself after your procedure. Your health care provider may also give you more specific instructions. Your treatment has been planned according to current medical practices, but problems sometimes occur. Call your health care provider if you have any problems or questions after your procedure. What can I expect after the procedure? After the procedure, it is common to have:  Vomiting.  A sore throat.  Mental slowness.  It is common to feel:  Nauseous.  Cold or shivery.  Sleepy.  Tired.  Sore or achy, even in parts of your body where you did not have  surgery.  Follow these instructions at home: For at least 24 hours after the procedure:  Do not: ? Participate in activities where you could fall or become injured. ? Drive. ? Use heavy machinery. ? Drink alcohol. ? Take sleeping pills or medicines that cause drowsiness. ? Make important decisions or sign legal documents. ? Take care of children on your own.  Rest. Eating and drinking  If you vomit, drink water, juice, or soup when you can drink without vomiting.  Drink enough fluid to keep your urine clear or pale yellow.  Make sure you have little or no nausea before eating solid foods.  Follow the diet recommended by your health care provider. General instructions  Have a responsible adult stay with you until you are awake and alert.  Return to your normal activities as told by your health care provider. Ask your health care provider what activities are safe for you.  Take over-the-counter and  prescription medicines only as told by your health care provider.  If you smoke, do not smoke without supervision.  Keep all follow-up visits as told by your health care provider. This is important. Contact a health care provider if:  You continue to have nausea or vomiting at home, and medicines are not helpful.  You cannot drink fluids or start eating again.  You cannot urinate after 8-12 hours.  You develop a skin rash.  You have fever.  You have increasing redness at the site of your procedure. Get help right away if:  You have difficulty breathing.  You have chest pain.  You have unexpected bleeding.  You feel that you are having a life-threatening or urgent problem. This information is not intended to replace advice given to you by your health care provider. Make sure you discuss any questions you have with your health care provider. Document Released: 05/26/2000 Document Revised: 07/23/2015 Document Reviewed: 02/01/2015 Elsevier Interactive Patient Education   [employer].

## 2018-01-21 ENCOUNTER — Ambulatory Visit: Payer: 59 | Admitting: Anesthesiology

## 2018-01-21 ENCOUNTER — Ambulatory Visit
Admission: RE | Admit: 2018-01-21 | Discharge: 2018-01-21 | Disposition: A | Discharge status: Home / Self Care | Payer: 59 | Source: Ambulatory Visit | Attending: Otolaryngology | Admitting: Otolaryngology

## 2018-01-21 ENCOUNTER — Encounter
Admission: RE | Disposition: A | Discharge status: Home / Self Care | Payer: Self-pay | Source: Ambulatory Visit | Attending: Otolaryngology

## 2018-01-21 DIAGNOSIS — J3501 Chronic tonsillitis: Secondary | ICD-10-CM | POA: Diagnosis not present

## 2018-01-21 DIAGNOSIS — J358 Other chronic diseases of tonsils and adenoids: Secondary | ICD-10-CM | POA: Diagnosis not present

## 2018-01-21 DIAGNOSIS — J351 Hypertrophy of tonsils: Secondary | ICD-10-CM | POA: Diagnosis not present

## 2018-01-21 HISTORY — DX: Fatty (change of) liver, not elsewhere classified: K76.0

## 2018-01-21 HISTORY — PX: TONSILLECTOMY: SHX5217

## 2018-01-21 HISTORY — DX: Gastro-esophageal reflux disease without esophagitis: K21.9

## 2018-01-21 SURGERY — TONSILLECTOMY
Anesthesia: General | Site: Throat

## 2018-01-21 MED ORDER — ACETAMINOPHEN 10 MG/ML IV SOLN
1000.0000 mg | Freq: Once | INTRAVENOUS | Status: AC
  Administered 2018-01-21: 1000 mg via INTRAVENOUS

## 2018-01-21 MED ORDER — PROMETHAZINE HCL 25 MG/ML IJ SOLN: 6.2500 mg | INTRAMUSCULAR | Status: DC | PRN

## 2018-01-21 MED ORDER — FENTANYL CITRATE (PF) 100 MCG/2ML IJ SOLN
INTRAMUSCULAR | Status: DC | PRN
  Administered 2018-01-21: 100 ug via INTRAVENOUS
  Administered 2018-01-21: 12.5 ug via INTRAVENOUS
  Administered 2018-01-21: 25 ug via INTRAVENOUS
  Administered 2018-01-21: 12.5 ug via INTRAVENOUS

## 2018-01-21 MED ORDER — ACETAMINOPHEN 325 MG PO TABS: 325.0000 mg | ORAL_TABLET | ORAL | Status: DC | PRN

## 2018-01-21 MED ORDER — SUCCINYLCHOLINE CHLORIDE 20 MG/ML IJ SOLN
INTRAMUSCULAR | Status: DC | PRN
  Administered 2018-01-21: 80 mg via INTRAVENOUS

## 2018-01-21 MED ORDER — LIDOCAINE HCL (CARDIAC) PF 100 MG/5ML IV SOSY
PREFILLED_SYRINGE | INTRAVENOUS | Status: DC | PRN
  Administered 2018-01-21: 40 mg via INTRAVENOUS

## 2018-01-21 MED ORDER — OXYCODONE HCL 5 MG/5ML PO SOLN
5.0000 mg | Freq: Once | ORAL | Status: AC | PRN
  Administered 2018-01-21: 5 mg via ORAL

## 2018-01-21 MED ORDER — ONDANSETRON HCL 4 MG/2ML IJ SOLN
INTRAMUSCULAR | Status: DC | PRN
  Administered 2018-01-21: 4 mg via INTRAVENOUS

## 2018-01-21 MED ORDER — GLYCOPYRROLATE 0.2 MG/ML IJ SOLN
INTRAMUSCULAR | Status: DC | PRN
  Administered 2018-01-21: .1 mg via INTRAVENOUS

## 2018-01-21 MED ORDER — SCOPOLAMINE 1 MG/3DAYS TD PT72
1.0000 | MEDICATED_PATCH | Freq: Once | TRANSDERMAL | Status: DC
  Administered 2018-01-21: 1.5 mg via TRANSDERMAL

## 2018-01-21 MED ORDER — DEXAMETHASONE SODIUM PHOSPHATE 4 MG/ML IJ SOLN
INTRAMUSCULAR | Status: DC | PRN
  Administered 2018-01-21: 8 mg via INTRAVENOUS

## 2018-01-21 MED ORDER — ACETAMINOPHEN 160 MG/5ML PO SOLN: 325.0000 mg | ORAL | Status: DC | PRN

## 2018-01-21 MED ORDER — OXYCODONE HCL 5 MG PO TABS: 5.0000 mg | ORAL_TABLET | Freq: Once | ORAL | Status: AC | PRN

## 2018-01-21 MED ORDER — SILVER NITRATE-POT NITRATE 75-25 % EX MISC
CUTANEOUS | Status: DC | PRN
  Administered 2018-01-21: 2

## 2018-01-21 MED ORDER — FENTANYL CITRATE (PF) 100 MCG/2ML IJ SOLN: 25.0000 ug | INTRAMUSCULAR | Status: DC | PRN

## 2018-01-21 MED ORDER — HYDROCODONE-ACETAMINOPHEN 7.5-325 MG/15ML PO SOLN: 15.0000 mL | Freq: Four times a day (QID) | ORAL | 0 refills | Status: AC | PRN

## 2018-01-21 MED ORDER — LACTATED RINGERS IV SOLN
10.0000 mL/h | INTRAVENOUS | Status: DC
  Administered 2018-01-21: 10 mL/h via INTRAVENOUS

## 2018-01-21 MED ORDER — PROPOFOL 10 MG/ML IV BOLUS
INTRAVENOUS | Status: DC | PRN
  Administered 2018-01-21: 150 mg via INTRAVENOUS
  Administered 2018-01-21: 30 mg via INTRAVENOUS

## 2018-01-21 MED ORDER — MIDAZOLAM HCL 5 MG/5ML IJ SOLN
INTRAMUSCULAR | Status: DC | PRN
  Administered 2018-01-21 (×2): 2 mg via INTRAVENOUS

## 2018-01-21 SURGICAL SUPPLY — 13 items
BLADE BOVIE TIP EXT 4 (BLADE) ×3 IMPLANT
CANISTER SUCT 1200ML W/VALVE (MISCELLANEOUS) ×3 IMPLANT
ELECT REM PT RETURN 9FT ADLT (ELECTROSURGICAL) ×3
ELECTRODE REM PT RTRN 9FT ADLT (ELECTROSURGICAL) ×1 IMPLANT
GLOVE PI ULTRA LF STRL 7.5 (GLOVE) ×1 IMPLANT
GLOVE PI ULTRA NON LATEX 7.5 (GLOVE) ×2
KIT TURNOVER KIT A (KITS) ×3 IMPLANT
PACK TONSIL/ADENOIDS (PACKS) ×3 IMPLANT
PENCIL SMOKE EVACUATOR (MISCELLANEOUS) ×3 IMPLANT
SLEEVE SUCTION 125 (MISCELLANEOUS) ×3 IMPLANT
SOL ANTI-FOG 6CC FOG-OUT (MISCELLANEOUS) ×1 IMPLANT
SOL FOG-OUT ANTI-FOG 6CC (MISCELLANEOUS) ×2
STRAP BODY AND KNEE 60X3 (MISCELLANEOUS) ×3 IMPLANT

## 2018-01-21 NOTE — Transfer of Care (Signed)
Immediate Anesthesia Transfer of Care Note  Patient: Katrina Campos  Procedure(s) Performed: TONSILLECTOMY (N/A Throat)  Patient Location: PACU  Anesthesia Type: General  Level of Consciousness: awake, alert  and patient cooperative  Airway and Oxygen Therapy: Patient Spontanous Breathing and Patient connected to supplemental oxygen  Post-op Assessment: Post-op Vital signs reviewed, Patient's Cardiovascular Status Stable, Respiratory Function Stable, Patent Airway and No signs of Nausea or vomiting  Post-op Vital Signs: Reviewed and stable  Complications: No apparent anesthesia complications

## 2018-01-21 NOTE — Anesthesia Procedure Notes (Signed)
Procedure Name: Intubation Date/Time: 01/21/2018 8:43 AM Performed by: Mayme Genta, CRNA Pre-anesthesia Checklist: Patient identified, Emergency Drugs available, Suction available, Patient being monitored and Timeout performed Patient Re-evaluated:Patient Re-evaluated prior to induction Oxygen Delivery Method: Circle system utilized Preoxygenation: Pre-oxygenation with 100% oxygen Induction Type: IV induction Ventilation: Mask ventilation without difficulty Laryngoscope Size: Miller and 2 Grade View: Grade I Tube type: Oral Rae Tube size: 7.0 mm Number of attempts: 1 Placement Confirmation: ETT inserted through vocal cords under direct vision,  positive ETCO2 and breath sounds checked- equal and bilateral Tube secured with: Tape Dental Injury: Teeth and Oropharynx as per pre-operative assessment

## 2018-01-21 NOTE — H&P (Signed)
H&P has been reviewed and patient reevaluated, no changes necessary. To be downloaded later.  

## 2018-01-21 NOTE — Anesthesia Postprocedure Evaluation (Signed)
Anesthesia Post Note  Patient: Katrina Campos  Procedure(s) Performed: TONSILLECTOMY (N/A Throat)  Patient location during evaluation: PACU Anesthesia Type: General Level of consciousness: awake Pain management: pain level controlled Vital Signs Assessment: post-procedure vital signs reviewed and stable Respiratory status: respiratory function stable Cardiovascular status: stable Postop Assessment: no signs of nausea or vomiting Anesthetic complications: no    Veda Canning

## 2018-01-21 NOTE — Anesthesia Preprocedure Evaluation (Signed)
Anesthesia Evaluation  Patient identified by MRN, date of birth, ID band Patient awake    Reviewed: Allergy & Precautions, NPO status , Patient's Chart, lab work & pertinent test results  Airway Mallampati: II  TM Distance: >3 FB     Dental   Pulmonary neg pulmonary ROS, neg recent URI,    breath sounds clear to auscultation       Cardiovascular Exercise Tolerance: Good negative cardio ROS   Rhythm:Regular Rate:Normal     Neuro/Psych    GI/Hepatic GERD  ,IBS   Endo/Other  BMI 32  Renal/GU      Musculoskeletal   Abdominal   Peds  Hematology   Anesthesia Other Findings   Reproductive/Obstetrics                             Anesthesia Physical Anesthesia Plan  ASA: II  Anesthesia Plan: General   Post-op Pain Management:    Induction: Intravenous  PONV Risk Score and Plan: 3 and Ondansetron, Dexamethasone and Scopolamine patch - Pre-op  Airway Management Planned: Oral ETT  Additional Equipment:   Intra-op Plan:   Post-operative Plan:   Informed Consent: I have reviewed the patients History and Physical, chart, labs and discussed the procedure including the risks, benefits and alternatives for the proposed anesthesia with the patient or authorized representative who has indicated his/her understanding and acceptance.   Dental advisory given  Plan Discussed with: CRNA  Anesthesia Plan Comments:         Anesthesia Quick Evaluation

## 2018-01-21 NOTE — Op Note (Signed)
01/21/2018  9:11 AM    Campos, Katrina Fuse  [id number]   Pre-Op Dx: Chronic tonsillitis and tonsillar lithiasis  Post-op Dx: Same  Proc: Tonsillectomy  Surg:  Katrina Campos  Anes:  GOT  EBL: 20 mL  Comp: None  Findings: The tonsils were cryptic and chronically inflamed.  The left tonsil was worse than the right.  Adenoids were not enlarged.  Procedure: The patient was brought to the operating room and placed in spine position.  She was given general anesthesia by oral endotracheal intubation.  When she was asleep a Shaune Pascal was used to visualize the oropharynx tonsils are fairly large and cryptic with lots of debris in him, especially the left side.  The soft palate was retracted to visualize the adenoids and these were not enlarged.  The tonsils were grasped and pulled medially.  The anterior pillar incised with electrocautery.  The tonsil was dissected from its fossa using blunt dissection and electrocautery.  Bleeding was controlled with direct pressure and electrocautery.  I had to use more cautery on her left side because of the inflammation and scarring to the tonsil bed.  This was completed on both sides.  The patient tolerated the procedure well.  She was awakened taken to the recovery room in satisfactory condition.  There were no operative complications.  Dispo:   To PACU to be discharged home.  Plan: To follow-up in the office in a couple weeks to reevaluate.  She will push liquids at home and have soft foods as tolerated.  Will use some Tylenol with hydrocodone liquid for pain and supplement this with Tylenol at home.  Katrina Campos  01/21/2018 9:11 AM

## 2018-01-22 ENCOUNTER — Encounter: Payer: Self-pay | Admitting: Otolaryngology

## 2018-01-25 DIAGNOSIS — Z79899 Other long term (current) drug therapy: Secondary | ICD-10-CM | POA: Diagnosis not present

## 2018-01-25 DIAGNOSIS — L700 Acne vulgaris: Secondary | ICD-10-CM | POA: Diagnosis not present

## 2018-01-25 LAB — SURGICAL PATHOLOGY

## 2018-02-09 ENCOUNTER — Ambulatory Visit: Payer: 59 | Admitting: Gastroenterology

## 2018-02-12 ENCOUNTER — Encounter: Payer: Self-pay | Admitting: Obstetrics and Gynecology

## 2018-02-12 ENCOUNTER — Ambulatory Visit (INDEPENDENT_AMBULATORY_CARE_PROVIDER_SITE_OTHER): Payer: 59 | Admitting: Obstetrics and Gynecology

## 2018-02-12 ENCOUNTER — Other Ambulatory Visit (HOSPITAL_COMMUNITY)
Admission: RE | Admit: 2018-02-12 | Discharge: 2018-02-12 | Disposition: A | Discharge status: Home / Self Care | Payer: 59 | Source: Ambulatory Visit | Attending: Obstetrics and Gynecology | Admitting: Obstetrics and Gynecology

## 2018-02-12 VITALS — BP 102/66 | HR 77 | Ht 63.0 in | Wt 184.4 lb

## 2018-02-12 DIAGNOSIS — Z01419 Encounter for gynecological examination (general) (routine) without abnormal findings: Secondary | ICD-10-CM | POA: Diagnosis not present

## 2018-02-12 MED ORDER — ETONOGESTREL-ETHINYL ESTRADIOL 0.12-0.015 MG/24HR VA RING: VAGINAL_RING | VAGINAL | 12 refills | Status: DC

## 2018-02-12 NOTE — Patient Instructions (Signed)
Preventive Care 18-39 Years, Female Preventive care refers to lifestyle choices and visits with your health care provider that can promote health and wellness. What does preventive care include?  A yearly physical exam. This is also called an annual well check.  Dental exams once or twice a year.  Routine eye exams. Ask your health care provider how often you should have your eyes checked.  Personal lifestyle choices, including: ? Daily care of your teeth and gums. ? Regular physical activity. ? Eating a healthy diet. ? Avoiding tobacco and drug use. ? Limiting alcohol use. ? Practicing safe sex. ? Taking vitamin and mineral supplements as recommended by your health care provider. What happens during an annual well check? The services and screenings done by your health care provider during your annual well check will depend on your age, overall health, lifestyle risk factors, and family history of disease. Counseling Your health care provider may ask you questions about your:  Alcohol use.  Tobacco use.  Drug use.  Emotional well-being.  Home and relationship well-being.  Sexual activity.  Eating habits.  Work and work Statistician.  Method of birth control.  Menstrual cycle.  Pregnancy history.  Screening You may have the following tests or measurements:  Height, weight, and BMI.  Diabetes screening. This is done by checking your blood sugar (glucose) after you have not eaten for a while (fasting).  Blood pressure.  Lipid and cholesterol levels. These may be checked every 5 years starting at age 38.  Skin check.  Hepatitis C blood test.  Hepatitis B blood test.  Sexually transmitted disease (STD) testing.  BRCA-related cancer screening. This may be done if you have a family history of breast, ovarian, tubal, or peritoneal cancers.  Pelvic exam and Pap test. This may be done every 3 years starting at age 20. Starting at age 30, this may be done  every 5 years if you have a Pap test in combination with an HPV test.  Discuss your test results, treatment options, and if necessary, the need for more tests with your health care provider. Vaccines Your health care provider may recommend certain vaccines, such as:  Influenza vaccine. This is recommended every year.  Tetanus, diphtheria, and acellular pertussis (Tdap, Td) vaccine. You may need a Td booster every 10 years.  Varicella vaccine. You may need this if you have not been vaccinated.  HPV vaccine. If you are 20 or younger, you may need three doses over 6 months.  Measles, mumps, and rubella (MMR) vaccine. You may need at least one dose of MMR. You may also need a second dose.  Pneumococcal 13-valent conjugate (PCV13) vaccine. You may need this if you have certain conditions and were not previously vaccinated.  Pneumococcal polysaccharide (PPSV23) vaccine. You may need one or two doses if you smoke cigarettes or if you have certain conditions.  Meningococcal vaccine. One dose is recommended if you are age 20-20 years and a first-year college student living in a residence hall, or if you have one of several medical conditions. You may also need additional booster doses.  Hepatitis A vaccine. You may need this if you have certain conditions or if you travel or work in places where you may be exposed to hepatitis A.  Hepatitis B vaccine. You may need this if you have certain conditions or if you travel or work in places where you may be exposed to hepatitis B.  Haemophilus influenzae type b (Hib) vaccine. You may need this  if you have certain risk factors.  Talk to your health care provider about which screenings and vaccines you need and how often you need them. This information is not intended to replace advice given to you by your health care provider. Make sure you discuss any questions you have with your health care provider. Document Released: 04/15/2001 Document Revised:  11/07/2015 Document Reviewed: 12/19/2014 Elsevier Interactive Patient Education  2018 [employer]  

## 2018-02-12 NOTE — Progress Notes (Signed)
  Subjective:     Katrina Campos is a single hispanic  22 y.o. female and is here for a comprehensive physical exam. The patient reports no problems.  Social History   Socioeconomic History  . Marital status: Single    Spouse name: Not on file  . Number of children: 0  . Years of education: Not on file  . Highest education level: Not on file  Occupational History  . Not on file  Social Needs  . Financial resource strain: Not on file  . Food insecurity:    Worry: Not on file    Inability: Not on file  . Transportation needs:    Medical: Not on file    Non-medical: Not on file  Tobacco Use  . Smoking status: Never Smoker  . Smokeless tobacco: Never Used  Substance and Sexual Activity  . Alcohol use: Yes    Frequency: Never    Comment: socially - 1x/mo  . Drug use: No  . Sexual activity: Yes    Partners: Female    Birth control/protection: Inserts  Lifestyle  . Physical activity:    Days per week: Not on file    Minutes per session: Not on file  . Stress: Not on file  Relationships  . Social connections:    Talks on phone: Not on file    Gets together: Not on file    Attends religious service: Not on file    Active member of club or organization: Not on file    Attends meetings of clubs or organizations: Not on file    Relationship status: Not on file  . Intimate partner violence:    Fear of current or ex partner: Not on file    Emotionally abused: Not on file    Physically abused: Not on file    Forced sexual activity: Not on file  Other Topics Concern  . Not on file  Social History Narrative  . Not on file   Health Maintenance  Topic Date Due  . CHLAMYDIA SCREENING  05/31/2012  . HIV Screening  05/31/2012  . TETANUS/TDAP  11/04/2027  . INFLUENZA VACCINE  Completed    The following portions of the patient's history were reviewed and updated as appropriate: allergies, current medications, past family history, past medical history, past social history,  past surgical history and problem list.  Review of Systems A comprehensive review of systems was negative.   Objective:    General appearance: alert, cooperative and appears stated age Neck: no adenopathy, no carotid bruit, no JVD, supple, symmetrical, trachea midline and thyroid not enlarged, symmetric, no tenderness/mass/nodules Lungs: clear to auscultation bilaterally Breasts: normal appearance, no masses or tenderness Heart: regular rate and rhythm, S1, S2 normal, no murmur, click, rub or gallop Abdomen: soft, non-tender; bowel sounds normal; no masses,  no organomegaly Pelvic: cervix normal in appearance, external genitalia normal, no adnexal masses or tenderness, no cervical motion tenderness, rectovaginal septum normal, uterus normal size, shape, and consistency and vagina normal without discharge    Assessment:    Healthy female exam.  HC user     Plan:  RTC 1 year or as needed.    Payten Hobin,CNM   See After Visit Summary for Counseling Recommendations

## 2018-02-15 LAB — GC/CHLAMYDIA PROBE AMP (~~LOC~~) NOT AT ARMC
Chlamydia: NEGATIVE
Neisseria Gonorrhea: NEGATIVE

## 2018-02-15 NOTE — Addendum Note (Signed)
Addended by: Keturah Barre L on: 02/15/2018 09:19 AM   Modules accepted: Orders

## 2018-03-01 DIAGNOSIS — L700 Acne vulgaris: Secondary | ICD-10-CM | POA: Diagnosis not present

## 2018-03-01 DIAGNOSIS — Z79899 Other long term (current) drug therapy: Secondary | ICD-10-CM | POA: Diagnosis not present

## 2018-03-01 DIAGNOSIS — K130 Diseases of lips: Secondary | ICD-10-CM | POA: Diagnosis not present

## 2018-04-01 DIAGNOSIS — L700 Acne vulgaris: Secondary | ICD-10-CM | POA: Diagnosis not present

## 2018-04-01 DIAGNOSIS — Z79899 Other long term (current) drug therapy: Secondary | ICD-10-CM | POA: Diagnosis not present

## 2018-05-03 DIAGNOSIS — L700 Acne vulgaris: Secondary | ICD-10-CM | POA: Diagnosis not present

## 2018-05-03 DIAGNOSIS — Z79899 Other long term (current) drug therapy: Secondary | ICD-10-CM | POA: Diagnosis not present

## 2018-05-18 DIAGNOSIS — L709 Acne, unspecified: Secondary | ICD-10-CM | POA: Diagnosis not present

## 2018-05-26 DIAGNOSIS — L700 Acne vulgaris: Secondary | ICD-10-CM | POA: Diagnosis not present

## 2018-06-21 DIAGNOSIS — Z79899 Other long term (current) drug therapy: Secondary | ICD-10-CM | POA: Diagnosis not present

## 2018-07-21 DIAGNOSIS — L700 Acne vulgaris: Secondary | ICD-10-CM | POA: Diagnosis not present

## 2018-08-23 DIAGNOSIS — L700 Acne vulgaris: Secondary | ICD-10-CM | POA: Diagnosis not present

## 2018-08-26 DIAGNOSIS — L700 Acne vulgaris: Secondary | ICD-10-CM | POA: Diagnosis not present

## 2018-10-05 ENCOUNTER — Ambulatory Visit
Admission: RE | Admit: 2018-10-05 | Discharge: 2018-10-05 | Disposition: A | Discharge status: Home / Self Care | Payer: 59 | Attending: Family Medicine | Admitting: Family Medicine

## 2018-10-05 ENCOUNTER — Ambulatory Visit (INDEPENDENT_AMBULATORY_CARE_PROVIDER_SITE_OTHER): Payer: 59 | Admitting: Family Medicine

## 2018-10-05 ENCOUNTER — Ambulatory Visit
Admission: RE | Admit: 2018-10-05 | Discharge: 2018-10-05 | Disposition: A | Discharge status: Home / Self Care | Payer: 59 | Source: Ambulatory Visit | Attending: Family Medicine | Admitting: Family Medicine

## 2018-10-05 ENCOUNTER — Other Ambulatory Visit: Payer: Self-pay

## 2018-10-05 ENCOUNTER — Encounter: Payer: Self-pay | Admitting: Family Medicine

## 2018-10-05 VITALS — BP 120/80 | HR 80 | Ht 63.0 in | Wt 187.0 lb

## 2018-10-05 DIAGNOSIS — S99922A Unspecified injury of left foot, initial encounter: Secondary | ICD-10-CM | POA: Diagnosis not present

## 2018-10-05 DIAGNOSIS — S93619A Sprain of tarsal ligament of unspecified foot, initial encounter: Secondary | ICD-10-CM | POA: Diagnosis not present

## 2018-10-05 MED ORDER — TRAMADOL HCL 50 MG PO TABS: 50.0000 mg | ORAL_TABLET | Freq: Four times a day (QID) | ORAL | 0 refills | Status: AC | PRN

## 2018-10-05 NOTE — Patient Instructions (Signed)
Tarsal Navicular Fracture  A tarsal navicular fracture is a break in a bone in the top middle area of the foot. The group of bones in the feet are called the tarsal bones. The navicular bone is wedged between other tarsal bones. Tarsal navicular fractures occur most often in athletes because running and jumping put a lot of pressure on the navicular bone. What are the causes? This condition may be caused by:  Severe twisting of the foot.  Something heavy falling on the foot.  Wear and tear on the bone from the foot striking the ground repeatedly (stress fracture). What increases the risk? You may be more likely to develop this condition if you:  Do high-impact activities, such as: ? Track and field. ? Football. ? Soccer. ? Basketball. ? Gymnastics. ? Ballet dancing.  Are a female who has an irregular menstrual cycle.  Have a condition that causes your bones to become thin and brittle (osteoporosis).  Smoke.  Start a new sport without being in good shape.  Wear athletic shoes that do not fit well. What are the signs or symptoms? Symptoms may include:  Swelling on the top of your foot.  Pain at the top of your foot. The pain may move down into the arch of your foot.  Pain when pressing on the top of your foot.  Pain when hopping on your foot.  Pain that gets worse with activity and better with rest. How is this diagnosed? This condition may be diagnosed based on:  Your symptoms.  Any recent foot injuries you have had.  A physical exam.  An X-ray of your foot. If you have a stress fracture, it may not show up on an X-ray, and you may need other imaging tests, such as: ? A bone scan. ? CT scan. ? MRI. How is this treated? Treatment depends on how severe your fracture is and how the pieces of the broken bone line up with each other (alignment).  If your broken bone is in good alignment, you will need to wear a cast or splint for at least 6 weeks. While wearing  the cast or splint, you will not be able to put any weight on your foot. You will be given crutches.  If your fracture is severe and the broken bone is not aligned (is displaced), you may need surgery to align the fracture (open reduction and internal fixation, ORIF). This is rare. During surgery, the bone pieces are fixed into place with metal screws or pins. Treatment will also include having follow-up visits with your health care provider to make sure you are healing. Follow these instructions at home: If you have a splint:  Wear the splint as told by your health care provider. Remove it only as told by your health care provider.  Loosen the splint if your toes tingle, become numb, or turn cold and blue.  Keep the splint clean and dry. If you have a cast:  Do not stick anything inside the cast to scratch your skin. Doing that increases your risk for infection.  Check the skin around the cast every day. Tell your health care provider about any concerns.  You may put lotion on dry skin around the edges of the cast. Do not put lotion on the skin underneath the cast.  Keep the cast clean and dry. Bathing  Do not take baths, swim, or use a hot tub until your health care provider approves. Ask your health care provider if you may  take showers. You may only be allowed to take sponge baths.  If your splint or cast is not waterproof: ? Do not let it get wet. ? Cover it with a watertight covering when you take a bath or a shower. Activity  Do not use your affected leg to support your body weight until your health care provider says that you can. Use crutches as directed.  Ask your health care provider what activities are safe for you during recovery, and what activities you need to avoid. Managing pain, stiffness, and swelling  If directed, put ice on painful areas: ? If you have a removable splint, remove it as told by your health care provider. ? Put ice in a plastic bag. ? Place a  towel between your skin and the bag, or between your cast and the bag. ? Leave the ice on for 20 minutes, 2-3 times a day.  Move your toes often to avoid stiffness and to lessen swelling.  Raise (elevate) your lower leg above the level of your heart while you are sitting or lying down, when possible. General instructions  Do not put pressure on any part of the cast or splint until it is fully hardened, if applicable. This may take several hours.  Take over-the-counter and prescription medicines only as told by your health care provider.  Do not drive until your health care provider approves. You should not drive or use heavy machinery while taking prescription pain medicine.  Do not use any products that contain nicotine or tobacco, such as cigarettes and e-cigarettes. These can delay bone healing. If you need help quitting, ask your health care provider.  Keep all follow-up visits as told by your health care provider. This is important. Contact a health care provider if you have:  Pain that gets worse or does not get better with medicine.  A fever.  A bad smell coming from your cast or splint. Get help right away if you have:  Any of the following in your toes or your foot, even after loosening your splint (if applicable): ? Numbness. ? Tingling. ? Coldness. ? Blue skin.  Redness or swelling that gets worse.  Pain that suddenly becomes severe. Summary  A tarsal navicular fracture is a break in a bone in the top middle area of the foot.  Tarsal navicular fractures occur most often in athletes because running and jumping put a lot of pressure on the navicular bone.  Do not use your affected leg to support your body weight until your health care provider says that you can.  Keep all follow-up visits as told by your health care provider. This is important. This information is not intended to replace advice given to you by your health care provider. Make sure you discuss any  questions you have with your health care provider. Document Released: 06/01/2000 Document Revised: 04/02/2017 Document Reviewed: 03/31/2017 Elsevier Patient Education  2020 [employer].

## 2018-10-05 NOTE — Progress Notes (Signed)
Date:  10/05/2018   Name:  Katrina Campos   DOB:  Nov 06, 1995   MRN:  [id number]   Chief Complaint: Ankle Pain ("planted ankle wrong"- swelling on the outside of ankle and "slowly starting to bruise"- throbbing and can't put pressure on it x 1 day)  Ankle Pain  The incident occurred 12 to 24 hours ago. The incident occurred at the gym. Injury mechanism: planted foot to kick ball. The pain is present in the left foot. The quality of the pain is described as aching and stabbing. The pain is moderate. The pain has been fluctuating since onset. Associated symptoms include an inability to bear weight and a loss of motion. Pertinent negatives include no loss of sensation, muscle weakness, numbness or tingling. The symptoms are aggravated by movement. She has tried acetaminophen for the symptoms. The treatment provided mild relief.    Review of Systems  Constitutional: Negative.  Negative for chills, fatigue, fever and unexpected weight change.  HENT: Negative for congestion, ear discharge, ear pain, rhinorrhea, sinus pressure, sneezing and sore throat.   Eyes: Negative for photophobia, pain, discharge, redness and itching.  Respiratory: Negative for cough, shortness of breath, wheezing and stridor.   Cardiovascular: Negative for chest pain, palpitations and leg swelling.  Gastrointestinal: Negative for abdominal pain, blood in stool, constipation, diarrhea, nausea and vomiting.  Endocrine: Negative for cold intolerance, heat intolerance, polydipsia, polyphagia and polyuria.  Genitourinary: Negative for dysuria, flank pain, frequency, hematuria, menstrual problem, pelvic pain, urgency, vaginal bleeding and vaginal discharge.  Musculoskeletal: Negative for arthralgias, back pain and myalgias.  Skin: Negative for rash.  Allergic/Immunologic: Negative for environmental allergies and food allergies.  Neurological: Negative for dizziness, tingling, weakness, light-headedness, numbness and headaches.   Hematological: Negative for adenopathy. Does not bruise/bleed easily.  Psychiatric/Behavioral: Negative for dysphoric mood. The patient is not nervous/anxious.     Patient Active Problem List   Diagnosis Date Noted  . Anxiety 06/08/2016  . Palpitations 04/28/2016  . Obesity 12/11/2015    No Known Allergies  Past Surgical History:  Procedure Laterality Date  . TONSILLECTOMY N/A 01/21/2018   Procedure: TONSILLECTOMY;  Surgeon: Margaretha Sheffield, MD;  Location: [place];  Service: ENT;  Laterality: N/A;  . tubes in ears      Social History   Tobacco Use  . Smoking status: Never Smoker  . Smokeless tobacco: Never Used  Substance Use Topics  . Alcohol use: Yes    Frequency: Never    Comment: socially - 1x/mo  . Drug use: No     Medication list has been reviewed and updated.  Current Meds  Medication Sig  . etonogestrel-ethinyl estradiol (NUVARING) 0.12-0.015 MG/24HR vaginal ring Insert vaginally and leave in place for 3 consecutive weeks, then remove for 1 week.    PHQ 2/9 Scores 10/05/2018 11/03/2017 06/09/2017 05/19/2017  PHQ - 2 Score 0 0 0 0  PHQ- 9 Score 1 0 - -    BP Readings from Last 3 Encounters:  10/05/18 120/80  02/12/18 102/66  01/21/18 106/67    Physical Exam Vitals signs and nursing note reviewed.  Constitutional:      Appearance: She is well-developed.  HENT:     Head: Normocephalic.     Right Ear: Tympanic membrane, ear canal and external ear normal.     Left Ear: Tympanic membrane, ear canal and external ear normal.  Eyes:     General: Lids are everted, no foreign bodies appreciated. No scleral icterus.  Left eye: No foreign body or hordeolum.     Conjunctiva/sclera: Conjunctivae normal.     Right eye: Right conjunctiva is not injected.     Left eye: Left conjunctiva is not injected.     Pupils: Pupils are equal, round, and reactive to light.  Neck:     Musculoskeletal: Normal range of motion and neck supple.     Thyroid: No  thyromegaly.     Vascular: No JVD.     Trachea: No tracheal deviation.  Cardiovascular:     Rate and Rhythm: Normal rate and regular rhythm.     Heart sounds: Normal heart sounds. No murmur. No friction rub. No gallop.   Pulmonary:     Effort: Pulmonary effort is normal. No respiratory distress.     Breath sounds: Normal breath sounds. No wheezing or rales.  Abdominal:     General: Bowel sounds are normal.     Palpations: Abdomen is soft. There is no mass.     Tenderness: There is no abdominal tenderness. There is no right CVA tenderness, left CVA tenderness, guarding or rebound.  Musculoskeletal: Normal range of motion.     Left foot: Tenderness present.       Feet:     Comments: Tender tarsal navicular  Lymphadenopathy:     Cervical: No cervical adenopathy.  Skin:    General: Skin is warm.     Findings: No rash.  Neurological:     Mental Status: She is alert and oriented to person, place, and time.     Cranial Nerves: No cranial nerve deficit.     Deep Tendon Reflexes: Reflexes normal.  Psychiatric:        Mood and Affect: Mood is not anxious or depressed.     Wt Readings from Last 3 Encounters:  10/05/18 187 lb (84.8 kg)  02/12/18 184 lb 6.4 oz (83.6 kg)  01/21/18 189 lb (85.7 kg)    BP 120/80   Pulse 80   Ht 5\' 3"  (1.6 m)   Wt 187 lb (84.8 kg)   LMP 09/30/2018 (Approximate)   BMI 33.13 kg/m   Assessment and Plan: 1. Sprain of tarsal ligament of foot, initial encounter Patient was walking up the plantar left foot before kicking a soccer ball when she had a sudden significant pain that she went to the floor with continued swelling tenderness and pain in the tarsal area of her left foot.  This occurred yesterday about 4:00 5:00.  There is tenderness over the navicular and the proximal first metatarsal.  Will x-ray the area to rule out fracture and will arrange a evaluation with podiatry extranodal clinic.  Patient was given tramadol 50 mg 1 every 6 hours as needed  severe pain for 5 days. - DG Foot Complete Left; Future - traMADol (ULTRAM) 50 MG tablet; Take 1 tablet (50 mg total) by mouth every 6 (six) hours as needed for up to 5 days.  Dispense: 20 tablet; Refill: 0 - Ambulatory referral to Podiatry

## 2018-10-07 DIAGNOSIS — M79672 Pain in left foot: Secondary | ICD-10-CM | POA: Diagnosis not present

## 2018-10-07 DIAGNOSIS — S93422A Sprain of deltoid ligament of left ankle, initial encounter: Secondary | ICD-10-CM | POA: Diagnosis not present

## 2019-01-17 ENCOUNTER — Other Ambulatory Visit: Payer: Self-pay

## 2019-01-17 ENCOUNTER — Telehealth: Payer: Self-pay | Admitting: Obstetrics and Gynecology

## 2019-01-17 MED ORDER — ETONOGESTREL-ETHINYL ESTRADIOL 0.12-0.015 MG/24HR VA RING: VAGINAL_RING | VAGINAL | 0 refills | Status: DC

## 2019-01-17 NOTE — Telephone Encounter (Signed)
The pt called and stated that she is wanting to know if she is able to get one refill of her birth control until she is seen with her new provider Deneise Lever next month. Pt rescheduled annual that was originally scheduled with Melody. Pt is requesting a call back to confirm that she is able to do so. Please advise.

## 2019-01-17 NOTE — Telephone Encounter (Signed)
Refill sent to pharmacy. Mychart message sent to pt informing her.

## 2019-02-15 ENCOUNTER — Encounter: Payer: 59 | Admitting: Obstetrics and Gynecology

## 2019-02-17 ENCOUNTER — Other Ambulatory Visit: Payer: Self-pay | Admitting: Certified Nurse Midwife

## 2019-03-01 ENCOUNTER — Encounter: Payer: Self-pay | Admitting: Certified Nurse Midwife

## 2019-03-01 ENCOUNTER — Other Ambulatory Visit: Payer: Self-pay

## 2019-03-01 ENCOUNTER — Other Ambulatory Visit (HOSPITAL_COMMUNITY)
Admission: RE | Admit: 2019-03-01 | Discharge: 2019-03-01 | Disposition: A | Discharge status: Home / Self Care | Payer: 59 | Source: Ambulatory Visit | Attending: Certified Nurse Midwife | Admitting: Certified Nurse Midwife

## 2019-03-01 ENCOUNTER — Ambulatory Visit (INDEPENDENT_AMBULATORY_CARE_PROVIDER_SITE_OTHER): Payer: 59 | Admitting: Certified Nurse Midwife

## 2019-03-01 VITALS — BP 88/49 | HR 86 | Ht 63.0 in | Wt 203.4 lb

## 2019-03-01 DIAGNOSIS — Z124 Encounter for screening for malignant neoplasm of cervix: Secondary | ICD-10-CM

## 2019-03-01 DIAGNOSIS — Z8679 Personal history of other diseases of the circulatory system: Secondary | ICD-10-CM | POA: Diagnosis not present

## 2019-03-01 DIAGNOSIS — Z01419 Encounter for gynecological examination (general) (routine) without abnormal findings: Secondary | ICD-10-CM

## 2019-03-01 MED ORDER — ETONOGESTREL-ETHINYL ESTRADIOL 0.12-0.015 MG/24HR VA RING: VAGINAL_RING | VAGINAL | 11 refills | Status: DC

## 2019-03-01 MED ORDER — DICYCLOMINE HCL 10 MG PO CAPS: 10.0000 mg | ORAL_CAPSULE | ORAL | 0 refills | Status: DC | PRN

## 2019-03-01 NOTE — Progress Notes (Signed)
GYNECOLOGY ANNUAL PREVENTATIVE CARE ENCOUNTER NOTE  History:     Katrina Campos is a 23 y.o. G0P0000 female here for a routine annual gynecologic exam.  Current complaints: constipation over the last few days that has now resolved. She is asking for refill on Bentyl x 1 month until she can get into GI doctor for her IBS.  Increase in cramps.  Denies abnormal vaginal bleeding, discharge, pelvic pain, problems with intercourse or other gynecologic concerns.     Single/not currently sexually active Works: Psychologist, prison and probation services Exercise: none, no time Alcohol socially, no drugs, no smoking   Gynecologic History Patient's last menstrual period was 02/11/2019 (exact date). Contraception: NuvaRing vaginal inserts Last Pap:  N/a .  Last mammogram: n/a  Obstetric History OB History  Gravida Para Term Preterm AB Living  0 0 0 0 0 0  SAB TAB Ectopic Multiple Live Births  0 0 0 0 0    Past Medical History:  Diagnosis Date  . Fatty liver   . GERD (gastroesophageal reflux disease)   . Heart murmur    Born with one  . IBS (irritable bowel syndrome) 01/2017    Past Surgical History:  Procedure Laterality Date  . TONSILLECTOMY N/A 01/21/2018   Procedure: TONSILLECTOMY;  Surgeon: Margaretha Sheffield, MD;  Location: [place];  Service: ENT;  Laterality: N/A;  . tubes in ears      Current Outpatient Medications on File Prior to Visit  Medication Sig Dispense Refill  . etonogestrel-ethinyl estradiol (NUVARING) 0.12-0.015 MG/24HR vaginal ring INSERT 1 RING VAGINALLY AS DIRECTED. REMOVE AFTER 3 WEEKS & WAIT 7 DAYS BEFORE INSERTING A NEW RING 1 each 0  . dicyclomine (BENTYL) 10 MG capsule Take 10 mg by mouth as needed (Makes sleepy but takes as needed for IBS.).     No current facility-administered medications on file prior to visit.    No Known Allergies  Social History:  reports that she has never smoked. She has never used smokeless tobacco.  She reports current alcohol use. She reports that she does not use drugs.  Family History  Problem Relation Age of Onset  . Diabetes Paternal Grandfather   . Hyperlipidemia Father   . Diabetes Father   . Heart disease Maternal Grandfather   . Sudden death Maternal Grandfather     The following portions of the patient's history were reviewed and updated as appropriate: allergies, current medications, past family history, past medical history, past social history, past surgical history and problem list.  Review of Systems Pertinent items noted in HPI and remainder of comprehensive ROS otherwise negative.  Physical Exam:  BP (!) 88/49   Pulse 86   Ht 5\' 3"  (1.6 m)   Wt 203 lb 6 oz (92.3 kg)   LMP 02/11/2019 (Exact Date)   BMI 36.03 kg/m  CONSTITUTIONAL: Well-developed, well-nourished,over weight female in no acute distress.  HENT:  Normocephalic, atraumatic, External right and left ear normal. Oropharynx is clear and moist EYES: Conjunctivae and EOM are normal. Pupils are equal, round, and reactive to light. No scleral icterus.  NECK: Normal range of motion, supple, no masses.  Normal thyroid.  SKIN: Skin is warm and dry. No rash noted. Not diaphoretic. No erythema. No pallor. MUSCULOSKELETAL: Normal range of motion. No tenderness.  No cyanosis, clubbing, or edema.  2+ distal pulses. NEUROLOGIC: Alert and oriented to person, place, and time. Normal reflexes, muscle tone coordination.  PSYCHIATRIC: Normal mood and affect.  Normal behavior. Normal judgment and thought content. CARDIOVASCULAR: Normal heart rate noted, regular rhythm RESPIRATORY: Clear to auscultation bilaterally. Effort and breath sounds normal, no problems with respiration noted. BREASTS: Symmetric in size. No masses, tenderness, skin changes, nipple drainage, or lymphadenopathy bilaterally. ABDOMEN: Soft, no distention noted.  No tenderness, rebound or guarding.  PELVIC: Normal appearing external genitalia and urethral  meatus; normal appearing vaginal mucosa and cervix.  No abnormal discharge noted.  Pap smear obtained.  Contact bleedingNormal uterine size, no other palpable masses, no uterine or adnexal tenderness.   Assessment and Plan:  Annual GYN exam  Will follow up results of pap smear and manage accordingly. Self help measures discussed to treat menstrual cramps, pt declines u/s at this time  Mammogram n/a Labs: none due Refills:Nuvaring , bentyl x 1 month Routine preventative health maintenance measures emphasized. Please refer to After Visit Summary for other counseling recommendations.      Philip Aspen, CNM

## 2019-03-01 NOTE — Patient Instructions (Signed)
Preventive Care 21-21 Years Old, Female Preventive care refers to visits with your health care provider and lifestyle choices that can promote health and wellness. This includes:  A yearly physical exam. This may also be called an annual well check.  Regular dental visits and eye exams.  Immunizations.  Screening for certain conditions.  Healthy lifestyle choices, such as eating a healthy diet, getting regular exercise, not using drugs or products that contain nicotine and tobacco, and limiting alcohol use. What can I expect for my preventive care visit? Physical exam Your health care provider will check your:  Height and weight. This may be used to calculate body mass index (BMI), which tells if you are at a healthy weight.  Heart rate and blood pressure.  Skin for abnormal spots. Counseling Your health care provider may ask you questions about your:  Alcohol, tobacco, and drug use.  Emotional well-being.  Home and relationship well-being.  Sexual activity.  Eating habits.  Work and work environment.  Method of birth control.  Menstrual cycle.  Pregnancy history. What immunizations do I need?  Influenza (flu) vaccine  This is recommended every year. Tetanus, diphtheria, and pertussis (Tdap) vaccine  You may need a Td booster every 10 years. Varicella (chickenpox) vaccine  You may need this if you have not been vaccinated. Human papillomavirus (HPV) vaccine  If recommended by your health care provider, you may need three doses over 6 months. Measles, mumps, and rubella (MMR) vaccine  You may need at least one dose of MMR. You may also need a second dose. Meningococcal conjugate (MenACWY) vaccine  One dose is recommended if you are age 19-21 years and a first-year college student living in a residence hall, or if you have one of several medical conditions. You may also need additional booster doses. Pneumococcal conjugate (PCV13) vaccine  You may need  this if you have certain conditions and were not previously vaccinated. Pneumococcal polysaccharide (PPSV23) vaccine  You may need one or two doses if you smoke cigarettes or if you have certain conditions. Hepatitis A vaccine  You may need this if you have certain conditions or if you travel or work in places where you may be exposed to hepatitis A. Hepatitis B vaccine  You may need this if you have certain conditions or if you travel or work in places where you may be exposed to hepatitis B. Haemophilus influenzae type b (Hib) vaccine  You may need this if you have certain conditions. You may receive vaccines as individual doses or as more than one vaccine together in one shot (combination vaccines). Talk with your health care provider about the risks and benefits of combination vaccines. What tests do I need?  Blood tests  Lipid and cholesterol levels. These may be checked every 5 years starting at age 20.  Hepatitis C test.  Hepatitis B test. Screening  Diabetes screening. This is done by checking your blood sugar (glucose) after you have not eaten for a while (fasting).  Sexually transmitted disease (STD) testing.  BRCA-related cancer screening. This may be done if you have a family history of breast, ovarian, tubal, or peritoneal cancers.  Pelvic exam and Pap test. This may be done every 3 years starting at age 21. Starting at age 30, this may be done every 5 years if you have a Pap test in combination with an HPV test. Talk with your health care provider about your test results, treatment options, and if necessary, the need for more tests.   Follow these instructions at home: Eating and drinking   Eat a diet that includes fresh fruits and vegetables, whole grains, lean protein, and low-fat dairy.  Take vitamin and mineral supplements as recommended by your health care provider.  Do not drink alcohol if: ? Your health care provider tells you not to drink. ? You are  pregnant, may be pregnant, or are planning to become pregnant.  If you drink alcohol: ? Limit how much you have to 0-1 drink a day. ? Be aware of how much alcohol is in your drink. In the [place] one drink equals one 12 oz bottle of beer (355 mL), one 5 oz glass of wine (148 mL), or one 1 oz glass of hard liquor (44 mL). Lifestyle  Take daily care of your teeth and gums.  Stay active. Exercise for at least 30 minutes on 5 or more days each week.  Do not use any products that contain nicotine or tobacco, such as cigarettes, e-cigarettes, and chewing tobacco. If you need help quitting, ask your health care provider.  If you are sexually active, practice safe sex. Use a condom or other form of birth control (contraception) in order to prevent pregnancy and STIs (sexually transmitted infections). If you plan to become pregnant, see your health care provider for a preconception visit. What's next?  Visit your health care provider once a year for a well check visit.  Ask your health care provider how often you should have your eyes and teeth checked.  Stay up to date on all vaccines. This information is not intended to replace advice given to you by your health care provider. Make sure you discuss any questions you have with your health care provider. Document Released: 04/15/2001 Document Revised: 10/29/2017 Document Reviewed: 10/29/2017 Elsevier Patient Education  2020 Elsevier Inc.  

## 2019-03-02 ENCOUNTER — Encounter: Payer: Self-pay | Admitting: Family Medicine

## 2019-03-03 LAB — CYTOLOGY - PAP: Diagnosis: NEGATIVE

## 2019-03-10 ENCOUNTER — Encounter: Payer: Self-pay | Admitting: Gastroenterology

## 2019-03-10 ENCOUNTER — Ambulatory Visit (INDEPENDENT_AMBULATORY_CARE_PROVIDER_SITE_OTHER): Payer: 59 | Admitting: Gastroenterology

## 2019-03-10 ENCOUNTER — Other Ambulatory Visit: Payer: Self-pay

## 2019-03-10 VITALS — BP 122/78 | HR 76 | Temp 98.8°F | Resp 17 | Ht 63.0 in | Wt 202.8 lb

## 2019-03-10 DIAGNOSIS — R7989 Other specified abnormal findings of blood chemistry: Secondary | ICD-10-CM | POA: Diagnosis not present

## 2019-03-10 DIAGNOSIS — K580 Irritable bowel syndrome with diarrhea: Secondary | ICD-10-CM | POA: Diagnosis not present

## 2019-03-10 DIAGNOSIS — K760 Fatty (change of) liver, not elsewhere classified: Secondary | ICD-10-CM

## 2019-03-10 MED ORDER — AMITRIPTYLINE HCL 25 MG PO TABS: 25.0000 mg | ORAL_TABLET | Freq: Every day | ORAL | 2 refills | Status: DC

## 2019-03-10 MED ORDER — HYOSCYAMINE SULFATE 0.125 MG PO TABS: 0.1250 mg | ORAL_TABLET | ORAL | 1 refills | Status: DC | PRN

## 2019-03-10 NOTE — Progress Notes (Signed)
Cephas Darby, MD [address]  [place], [place] [address]  Main: [phone]  Fax: [phone]    Gastroenterology Consultation  Referring Provider:     Juline Patch, MD Primary Care Physician:  Juline Patch, MD Primary Gastroenterologist:  Dr. Cephas Darby Reason for Consultation: Irritable bowel syndrome        HPI:   Katrina Campos is a 24 y.o. female referred by Dr. Juline Patch, MD  for consultation & management of and chronic upper abdominal pain and new onset of elevated LFTs. Patient reports approximately 3 months history of epigastric pain, associated with nausea, right upper quadrant pain, constant and no relation to food. She underwent ultrasound abdomen which revealed fatty liver, here was no evidence of cholelithiasis, normal caliber CBD. She was also found to have mildly elevated transaminases 2-3x ULN on 05/29/2017. Her last normal LFTs were on 05/13/2017. Patient has history of severe anxiety and stress which is currently under control and she thinks her symptoms are mostly related to it. Patient reports that her right upper quadrant and upper abdominal pain has subsided. She has gained weight recently, TSH is normal. She also reports history of palpitations secondary to anxiety and underwent echocardiogram and stress echocardiogram which were unremarkable. 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 She otherwise denies practicing unprotected sex, IV drug abuse, blood transfusions, recent sickness. She reports that she is immunized against hepatitis A and B. No known HIV. She does reports that she consumes diet soda, red meat, white bread regularly  She denies using herbal supplements, excess Tylenol use or NSAID use  Follow-up visit 09/11/2017 She lost about 9 pounds since last visit by following healthy diet and exercise. She is not consuming red meat, fast foods. She denies any GI symptoms that  she was experiencing before. She feels well overall. She is taking Bentyl as needed and Protonix daily  Follow-up visit 03/10/2019 Patient reports that she has been experiencing postprandial urgency, loose stools associated with lower abdominal cramps.  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  This has been very stressful for her.  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  She does not sleep well.  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  She denies weight loss, in fact she gained few pounds.  She initially experienced constipation, had to take MiraLAX, milk of magnesia which helped.  She does not experience right upper quadrant pain, nausea or vomiting.  She tried Bentyl however it makes her tired and would like to know if she can try any other alternative medication.  NSAIDs: none  Antiplts/Anticoagulants/Anti thrombotics: none  GI Procedures: none She denies family history of liver disease or GI malignancy  Past Medical History:  Diagnosis Date  . Fatty liver   . GERD (gastroesophageal reflux disease)   . Heart murmur    Born with one  . IBS (irritable bowel syndrome) 01/2017    Past Surgical History:  Procedure Laterality Date  . TONSILLECTOMY N/A 01/21/2018   Procedure: TONSILLECTOMY;  Surgeon: Margaretha Sheffield, MD;  Location: [facility];  Service: ENT;  Laterality: N/A;  . tubes in ears       Current Outpatient Medications:  .  dicyclomine (BENTYL)  10 MG capsule, Take 1 capsule (10 mg total) by mouth as needed (Makes sleepy but takes as needed for IBS.)., Disp: 30 capsule, Rfl: 0 .  etonogestrel-ethinyl estradiol (NUVARING) 0.12-0.015 MG/24HR vaginal ring, INSERT 1 RING VAGINALLY AS DIRECTED. REMOVE AFTER 3 WEEKS & WAIT 7 DAYS BEFORE INSERTING A NEW RING, Disp: 1 each, Rfl: 11 .  amitriptyline (ELAVIL) 25 MG  tablet, Take 1 tablet (25 mg total) by mouth at bedtime., Disp: 30 tablet, Rfl: 2 .  hyoscyamine (LEVSIN) 0.125 MG tablet, Take 1 tablet (0.125 mg total) by mouth every 4 (four) hours as needed., Disp: 30 tablet, Rfl: 1   Family History  Problem Relation Age of Onset  . Diabetes Paternal Grandfather   . Hyperlipidemia Father   . Diabetes Father   . Heart disease Maternal Grandfather   . Sudden death Maternal Grandfather      Social History   Tobacco Use  . Smoking status: Never Smoker  . Smokeless tobacco: Never Used  Substance Use Topics  . Alcohol use: Yes    Comment: socially - 1x/mo  . Drug use: No    Allergies as of 03/10/2019  . (No Known Allergies)    Review of Systems:    All systems reviewed and negative except where noted in HPI.   Physical Exam:  BP 122/78 (BP Location: Left Arm, Patient Position: Sitting, Cuff Size: Large)   Pulse 76   Temp 98.8 F (37.1 C)   Resp 17   Ht 5\' 3"  (1.6 m)   Wt 202 lb 12.8 oz (92 kg)   LMP 02/11/2019 (Exact Date)   BMI 35.92 kg/m  Patient's last menstrual period was 02/11/2019 (exact date).  General:   Alert,  Well-developed, well-nourished, pleasant and cooperative in NAD Head:  Normocephalic and atraumatic. Eyes:  Sclera clear, no icterus.   Conjunctiva pink. Ears:  Normal auditory acuity. Nose:  No deformity, discharge, or lesions. Mouth:  No deformity or lesions,oropharynx pink & moist. Neck:  Supple; no masses or thyromegaly. Lungs:  Respirations even and unlabored.  Clear throughout to auscultation.   No wheezes, crackles, or rhonchi. No acute distress. Heart:  Regular rate and rhythm; no murmurs, clicks, rubs, or gallops. Abdomen:  Normal bowel sounds. Soft, nontender, and non-distended without masses, hepatosplenomegaly or hernias noted.  No guarding or rebound tenderness.   Rectal: Not performed Msk:  Symmetrical without gross deformities. Good, equal movement & strength bilaterally. Pulses:  Normal pulses  noted. Extremities:  No clubbing or edema.  No cyanosis. Neurologic:  Alert and oriented x3;  grossly normal neurologically. Skin:  Intact without significant lesions or rashes. No jaundice. Psych:  Alert and cooperative. Normal mood and affect.  Imaging Studies: reviewed  Assessment and Plan:   RYNA CARTAGENA is a 24 y.o. 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 Ronnald Ramp to get blood test done tomorrow Reiterated on healthy eating habits, weight loss If LFTs are persistently elevated or rising, will perform secondary liver disease work-up  Follow up in 3 months   Cephas Darby, MD

## 2019-03-11 ENCOUNTER — Ambulatory Visit: Payer: 59 | Admitting: Family Medicine

## 2019-03-15 NOTE — Progress Notes (Unsigned)
Pap entered

## 2019-03-17 ENCOUNTER — Other Ambulatory Visit: Payer: Self-pay

## 2019-03-17 ENCOUNTER — Encounter: Payer: Self-pay | Admitting: Family Medicine

## 2019-03-17 ENCOUNTER — Ambulatory Visit: Payer: 59 | Admitting: Family Medicine

## 2019-03-17 VITALS — BP 120/80 | HR 80 | Ht 63.0 in | Wt 201.0 lb

## 2019-03-17 DIAGNOSIS — R110 Nausea: Secondary | ICD-10-CM

## 2019-03-17 MED ORDER — SUCRALFATE 1 G PO TABS: 1.0000 g | ORAL_TABLET | Freq: Three times a day (TID) | ORAL | 1 refills | Status: DC

## 2019-03-17 MED ORDER — ONDANSETRON HCL 4 MG PO TABS: 4.0000 mg | ORAL_TABLET | Freq: Three times a day (TID) | ORAL | 6 refills | Status: DC | PRN

## 2019-03-17 NOTE — Progress Notes (Signed)
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 needs something for the nausea)  Headache  This is a new problem. The current episode started more than 1 month ago. The problem has been unchanged. The pain is located in the temporal region. The quality of the pain is described as aching. Associated symptoms include nausea and tinnitus. Pertinent negatives include no abdominal pain, back pain, coughing, dizziness, ear pain, eye pain, eye redness, fever, numbness, photophobia, rhinorrhea, sinus pressure, sore throat, vomiting or weakness. Nothing aggravates the symptoms. She has tried acetaminophen for the symptoms. The treatment provided mild relief.    Lab Results  Component Value Date   CREATININE 0.77 05/13/2017   BUN 13 05/13/2017   NA 135 05/13/2017   K 3.5 05/13/2017   CL 104 05/13/2017   CO2 20 (L) 05/13/2017   Lab Results  Component Value Date   CHOL 167 05/29/2017   HDL 37 (L) 05/29/2017   LDLCALC 120 (H) 05/29/2017   TRIG 50 05/29/2017   Lab Results  Component Value Date   TSH 1.08 04/28/2016   Lab Results  Component Value Date   HGBA1C 5.3 04/28/2016     Review of Systems  Constitutional: Negative.  Negative for chills, fatigue, fever and unexpected weight change.  HENT: Positive for tinnitus. Negative for congestion, ear discharge, ear pain, rhinorrhea, sinus pressure, sneezing and sore throat.   Eyes: Negative for photophobia, pain, discharge, redness and itching.  Respiratory: Negative for cough, shortness of breath, wheezing and stridor.   Cardiovascular: Negative for chest pain, palpitations and leg swelling.  Gastrointestinal: Positive for nausea. Negative for abdominal pain, blood in stool, constipation, diarrhea and vomiting.  Endocrine: Negative for cold intolerance, heat intolerance, polydipsia,  polyphagia and polyuria.  Genitourinary: Negative for dysuria, flank pain, frequency, hematuria, menstrual problem, pelvic pain, urgency, vaginal bleeding and vaginal discharge.  Musculoskeletal: Negative for arthralgias, back pain and myalgias.  Skin: Negative for rash.  Allergic/Immunologic: Negative for environmental allergies and food allergies.  Neurological: Positive for headaches. Negative for dizziness, weakness, light-headedness and numbness.  Hematological: Negative for adenopathy. Does not bruise/bleed easily.  Psychiatric/Behavioral: Negative for dysphoric mood. The patient is not nervous/anxious.     Patient Active Problem List   Diagnosis Date Noted  . Hx of cardiac murmur 03/01/2019  . Anxiety 06/08/2016  . Palpitations 04/28/2016  . Obesity 12/11/2015    No Known Allergies  Past Surgical History:  Procedure Laterality Date  . TONSILLECTOMY N/A 01/21/2018   Procedure: TONSILLECTOMY;  Surgeon: Margaretha Sheffield, MD;  Location: [facility];  Service: ENT;  Laterality: N/A;  . tubes in ears      Social History   Tobacco Use  . Smoking status: Never Smoker  . Smokeless tobacco: Never Used  Substance Use Topics  . Alcohol use: Yes    Comment: socially - 1x/mo  . Drug use: No     Medication list has been reviewed and updated.  Current Meds  Medication Sig  . amitriptyline (ELAVIL) 25 MG tablet Take 1 tablet (25 mg total) by mouth at bedtime. (Patient taking differently: Take 25 mg by mouth at bedtime. GI)  . etonogestrel-ethinyl estradiol (NUVARING) 0.12-0.015 MG/24HR vaginal ring INSERT 1 RING VAGINALLY AS DIRECTED. REMOVE AFTER 3 WEEKS & WAIT 7 DAYS BEFORE INSERTING A NEW  RING  . hyoscyamine (LEVSIN) 0.125 MG tablet Take 1 tablet (0.125 mg total) by mouth every 4 (four) hours as needed. (Patient taking differently: Take 0.125 mg by mouth every 4 (four) hours as needed. GI)    PHQ 2/9 Scores 10/05/2018 11/03/2017 06/09/2017 05/19/2017  PHQ - 2 Score 0 0 0 0    PHQ- 9 Score 1 0 - -    BP Readings from Last 3 Encounters:  03/17/19 120/80  03/10/19 122/78  03/01/19 (!) 88/49    Physical Exam Vitals and nursing note reviewed.  Constitutional:      General: She is not in acute distress.    Appearance: She is not diaphoretic.  HENT:     Head: Normocephalic and atraumatic.     Right Ear: Tympanic membrane, ear canal and external ear normal.     Left Ear: Tympanic membrane, ear canal and external ear normal.     Nose: Nose normal. No congestion or rhinorrhea.  Eyes:     General:        Right eye: No discharge.        Left eye: No discharge.     Conjunctiva/sclera: Conjunctivae normal.     Pupils: Pupils are equal, round, and reactive to light.  Neck:     Thyroid: No thyromegaly.     Vascular: No JVD.  Cardiovascular:     Rate and Rhythm: Normal rate and regular rhythm.     Heart sounds: Normal heart sounds. No murmur. No friction rub. No gallop.   Pulmonary:     Effort: Pulmonary effort is normal.     Breath sounds: Normal breath sounds. No wheezing, rhonchi or rales.  Abdominal:     General: Bowel sounds are normal.     Palpations: Abdomen is soft. There is no mass.     Tenderness: There is no abdominal tenderness. There is no guarding.  Musculoskeletal:        General: Normal range of motion.     Cervical back: Normal range of motion and neck supple.  Lymphadenopathy:     Cervical: No cervical adenopathy.  Skin:    General: Skin is warm and dry.  Neurological:     Mental Status: She is alert.     Deep Tendon Reflexes: Reflexes are normal and symmetric.     Wt Readings from Last 3 Encounters:  03/17/19 201 lb (91.2 kg)  03/10/19 202 lb 12.8 oz (92 kg)  03/01/19 203 lb 6 oz (92.3 kg)    BP 120/80   Pulse 80   Ht 5\' 3"  (1.6 m)   Wt 201 lb (91.2 kg)   LMP 03/02/2019 (Approximate)   BMI 35.61 kg/m   Assessment and Plan:  1. 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  This causes the patient to have tendinitis which in turn contributes to a headache in the bitemporal area which in turn causes nausea.  We will address the nausea with 2 including sucralfate 1 g twice a day in the morning and before lunch.  And we will also provide Zofran 4 mg 1/2-1 up to every 8 hours but intended to be used on a as needed basis for nausea and vomiting when there is breakthrough if the Carafate does not cover. - sucralfate (CARAFATE) 1 g tablet; Take 1 tablet (1 g total) by mouth 4 (four) times daily -  with meals and at  bedtime.  Dispense: 60 tablet; Refill: 1 - ondansetron (ZOFRAN) 4 MG tablet; Take 1 tablet (4 mg total) by mouth every 8 (eight) hours as needed for nausea or vomiting.  Dispense: 30 tablet; Refill: 6  Addendum it was suggested because of the patient's elevated transaminases and likely steatosis that we were to obtain a hepatic panel.  This was forgotten and we will call Marissa to come back and we will obtain a hepatic panel.

## 2019-04-01 ENCOUNTER — Other Ambulatory Visit: Payer: Self-pay | Admitting: Gastroenterology

## 2019-04-01 DIAGNOSIS — K580 Irritable bowel syndrome with diarrhea: Secondary | ICD-10-CM

## 2019-05-03 ENCOUNTER — Ambulatory Visit: Payer: 59 | Admitting: Gastroenterology

## 2019-05-28 DIAGNOSIS — Z20822 Contact with and (suspected) exposure to covid-19: Secondary | ICD-10-CM | POA: Diagnosis not present

## 2019-06-04 IMAGING — US US ABDOMEN LIMITED
1 series · 14 of 25 positions shown · non-contrast
Comparison: None.

CLINICAL DATA: Three month history of right upper quadrant
abdominal pain

EXAM:
ULTRASOUND ABDOMEN LIMITED RIGHT UPPER QUADRANT

[Series 1: us abdomen limited · 0.22mm/px · 14 of 42 slices shown]
[im 1/42]
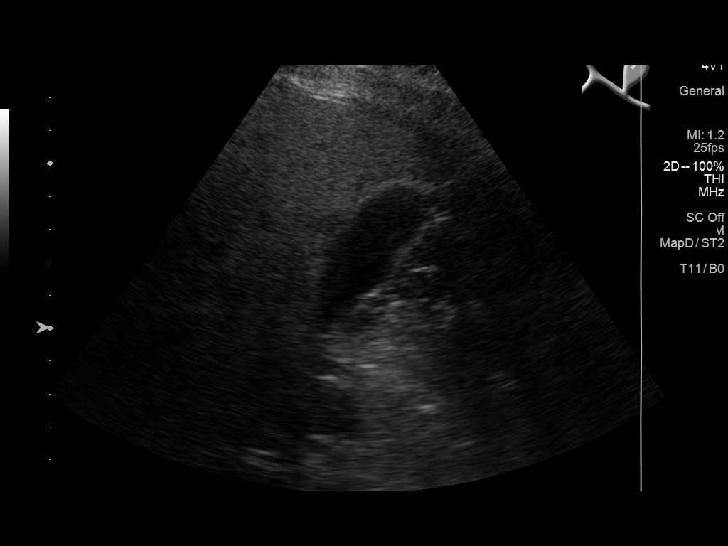
[im 4/42]
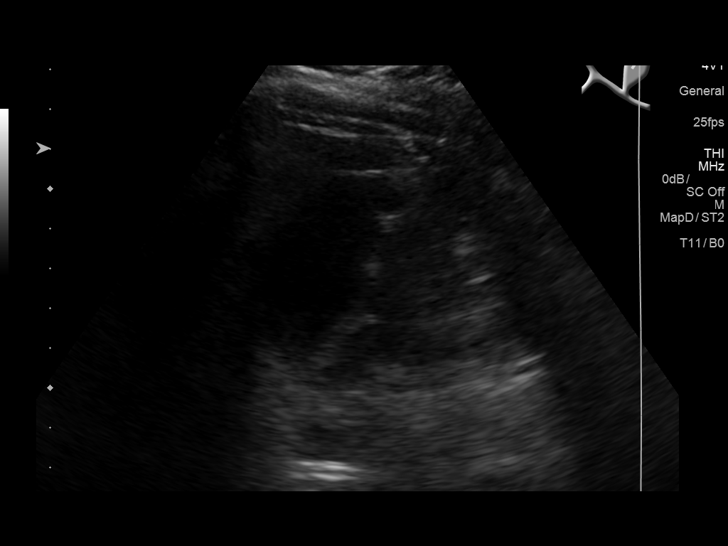
[im 7/42]
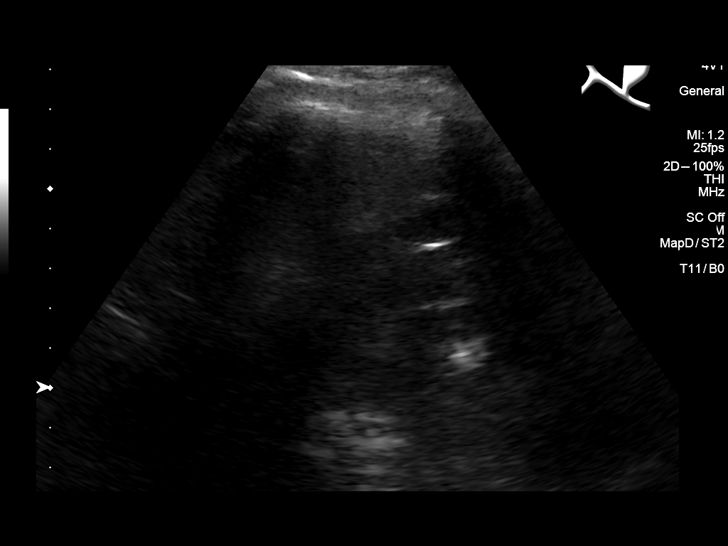
[im 11/42]
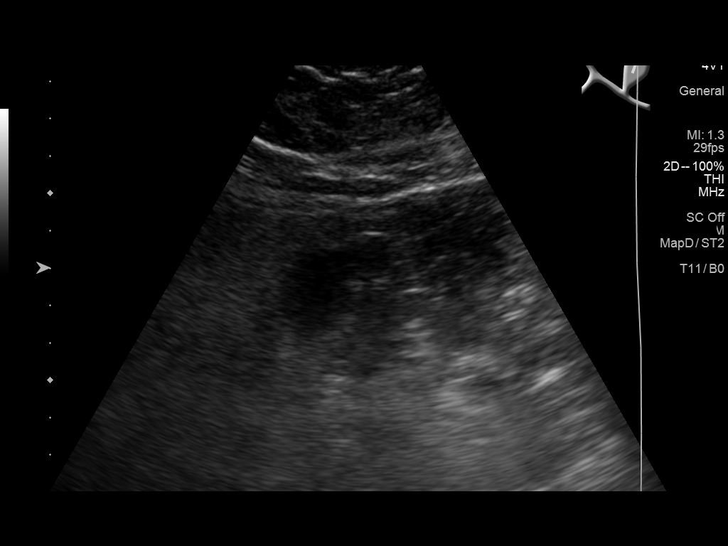
[im 14/42]
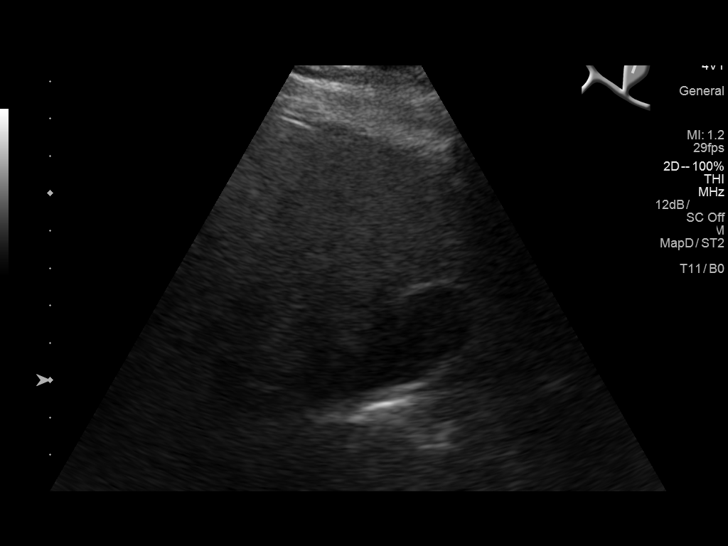
[im 16/42]
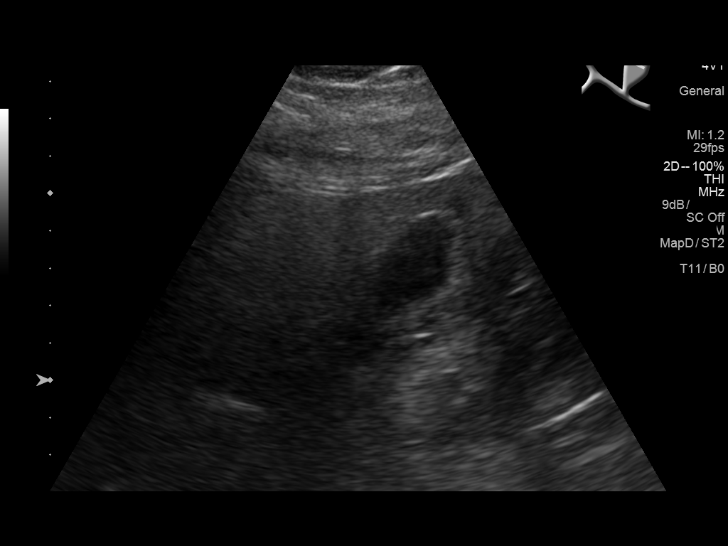
[im 19/42]
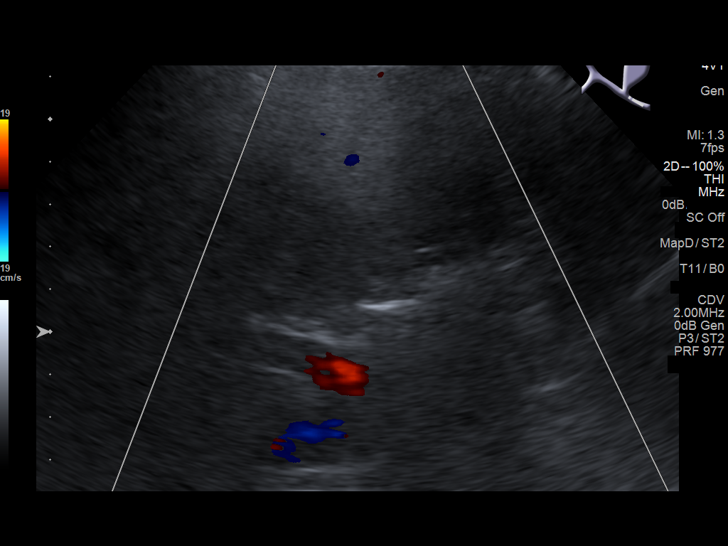
[im 23/42]
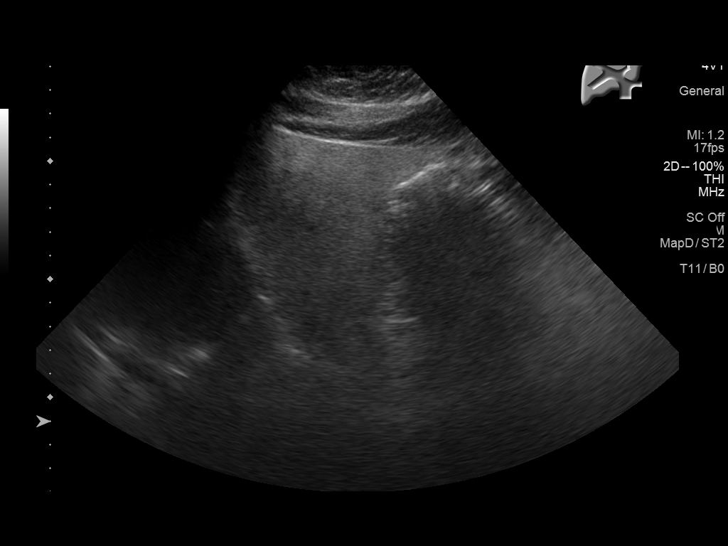
[im 26/42]
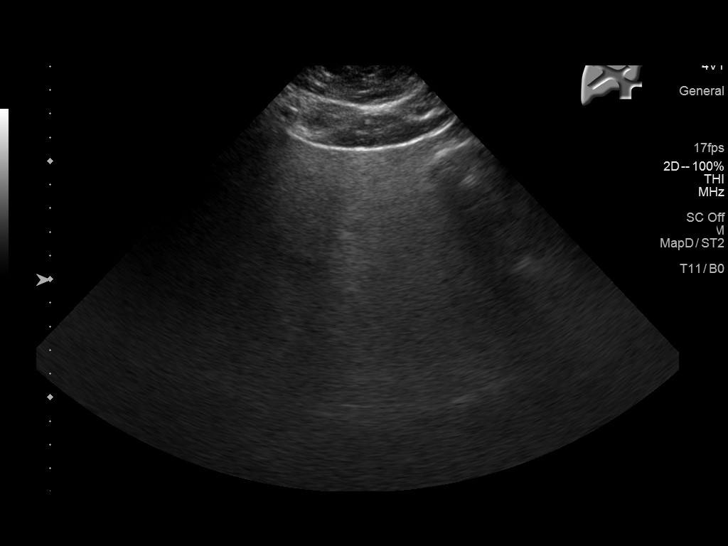
[im 28/42]
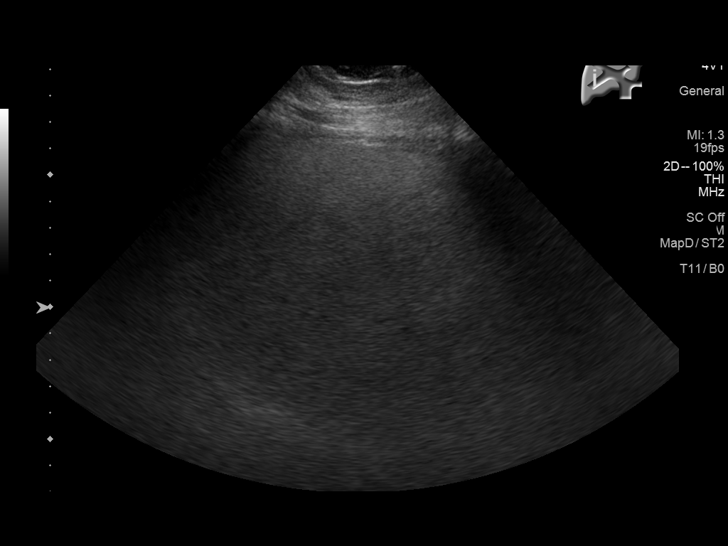
[im 31/42]
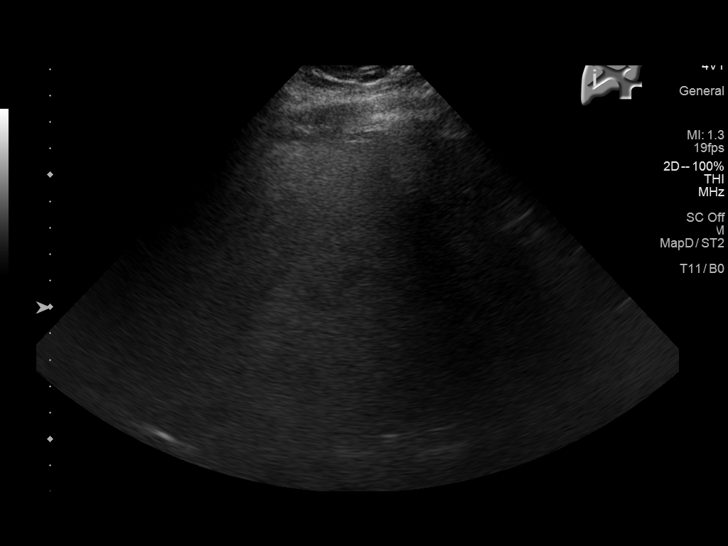
[im 35/42]
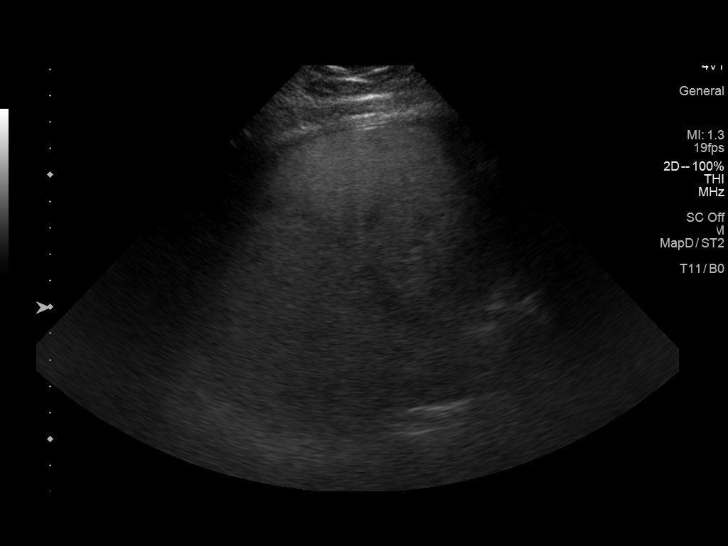
[im 38/42]
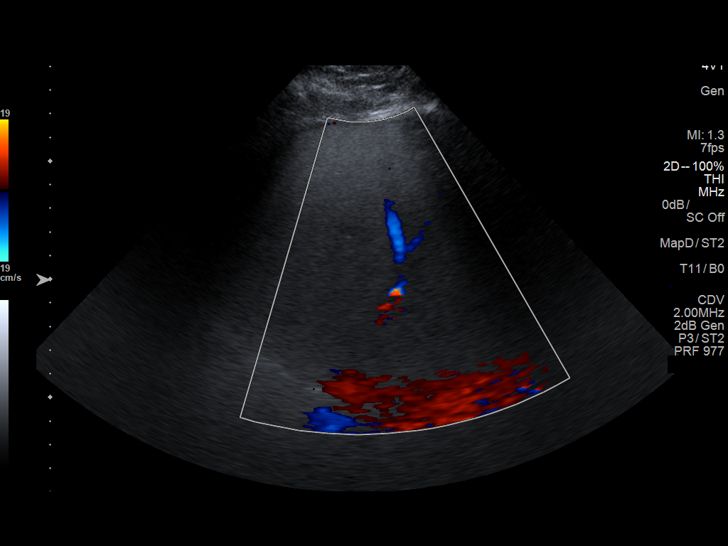
[im 42/42]
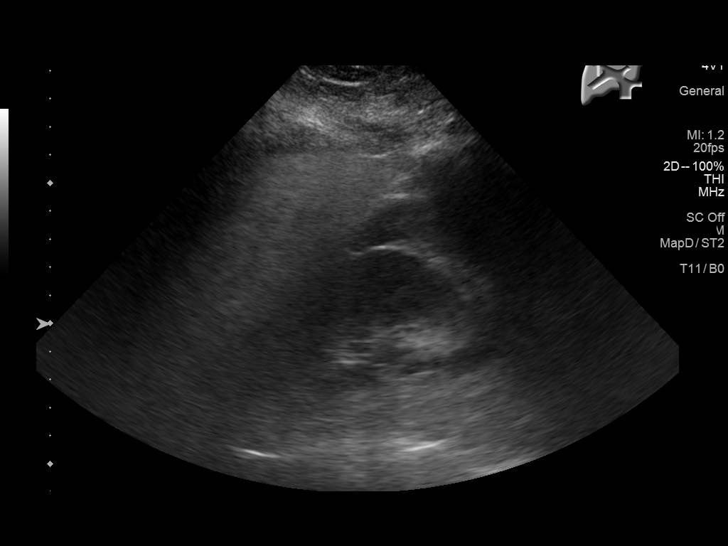

[14 of 25 positions shown; findings below may reference images not displayed]

FINDINGS: Gallbladder:

No gallstones or wall thickening visualized. No sonographic Murphy
sign noted by sonographer.

Common bile duct:

Diameter: 3 mm

Liver:

The hepatic echotexture is increased diffusely which limits
penetration by the ultrasound beam. There is no focal mass or ductal
dilation. The surface contour of the liver is smooth. Portal vein is
patent on color Doppler imaging with normal direction of blood flow
towards the liver.
IMPRESSION: No gallstones or sonographic evidence of acute cholecystitis. If
there are clinical concerns of chronic cholecystitis, a nuclear
medicine hepatobiliary scan with gallbladder ejection fraction
determination may be useful.

Increased hepatic echotexture most compatible with fatty
infiltrative change.

## 2019-06-14 ENCOUNTER — Ambulatory Visit: Payer: 59 | Admitting: Gastroenterology

## 2019-06-29 DIAGNOSIS — M9902 Segmental and somatic dysfunction of thoracic region: Secondary | ICD-10-CM | POA: Diagnosis not present

## 2019-06-29 DIAGNOSIS — M955 Acquired deformity of pelvis: Secondary | ICD-10-CM | POA: Diagnosis not present

## 2019-06-29 DIAGNOSIS — M546 Pain in thoracic spine: Secondary | ICD-10-CM | POA: Diagnosis not present

## 2019-06-29 DIAGNOSIS — M9905 Segmental and somatic dysfunction of pelvic region: Secondary | ICD-10-CM | POA: Diagnosis not present

## 2019-06-29 DIAGNOSIS — M5442 Lumbago with sciatica, left side: Secondary | ICD-10-CM | POA: Diagnosis not present

## 2019-06-29 DIAGNOSIS — M9903 Segmental and somatic dysfunction of lumbar region: Secondary | ICD-10-CM | POA: Diagnosis not present

## 2019-06-29 DIAGNOSIS — M461 Sacroiliitis, not elsewhere classified: Secondary | ICD-10-CM | POA: Diagnosis not present

## 2019-06-29 DIAGNOSIS — M62830 Muscle spasm of back: Secondary | ICD-10-CM | POA: Diagnosis not present

## 2019-06-29 DIAGNOSIS — M6080 Other myositis, unspecified site: Secondary | ICD-10-CM | POA: Diagnosis not present

## 2019-07-05 DIAGNOSIS — M546 Pain in thoracic spine: Secondary | ICD-10-CM | POA: Diagnosis not present

## 2019-07-05 DIAGNOSIS — M461 Sacroiliitis, not elsewhere classified: Secondary | ICD-10-CM | POA: Diagnosis not present

## 2019-07-05 DIAGNOSIS — M6080 Other myositis, unspecified site: Secondary | ICD-10-CM | POA: Diagnosis not present

## 2019-07-05 DIAGNOSIS — M5442 Lumbago with sciatica, left side: Secondary | ICD-10-CM | POA: Diagnosis not present

## 2019-07-05 DIAGNOSIS — M9902 Segmental and somatic dysfunction of thoracic region: Secondary | ICD-10-CM | POA: Diagnosis not present

## 2019-07-05 DIAGNOSIS — M9903 Segmental and somatic dysfunction of lumbar region: Secondary | ICD-10-CM | POA: Diagnosis not present

## 2019-07-05 DIAGNOSIS — M955 Acquired deformity of pelvis: Secondary | ICD-10-CM | POA: Diagnosis not present

## 2019-07-05 DIAGNOSIS — M62830 Muscle spasm of back: Secondary | ICD-10-CM | POA: Diagnosis not present

## 2019-07-05 DIAGNOSIS — M9905 Segmental and somatic dysfunction of pelvic region: Secondary | ICD-10-CM | POA: Diagnosis not present

## 2019-07-08 DIAGNOSIS — M9902 Segmental and somatic dysfunction of thoracic region: Secondary | ICD-10-CM | POA: Diagnosis not present

## 2019-07-08 DIAGNOSIS — M6080 Other myositis, unspecified site: Secondary | ICD-10-CM | POA: Diagnosis not present

## 2019-07-08 DIAGNOSIS — M5442 Lumbago with sciatica, left side: Secondary | ICD-10-CM | POA: Diagnosis not present

## 2019-07-08 DIAGNOSIS — M955 Acquired deformity of pelvis: Secondary | ICD-10-CM | POA: Diagnosis not present

## 2019-07-08 DIAGNOSIS — M546 Pain in thoracic spine: Secondary | ICD-10-CM | POA: Diagnosis not present

## 2019-07-08 DIAGNOSIS — M461 Sacroiliitis, not elsewhere classified: Secondary | ICD-10-CM | POA: Diagnosis not present

## 2019-07-08 DIAGNOSIS — M9903 Segmental and somatic dysfunction of lumbar region: Secondary | ICD-10-CM | POA: Diagnosis not present

## 2019-07-08 DIAGNOSIS — M62830 Muscle spasm of back: Secondary | ICD-10-CM | POA: Diagnosis not present

## 2019-07-08 DIAGNOSIS — M9905 Segmental and somatic dysfunction of pelvic region: Secondary | ICD-10-CM | POA: Diagnosis not present

## 2019-07-20 DIAGNOSIS — M461 Sacroiliitis, not elsewhere classified: Secondary | ICD-10-CM | POA: Diagnosis not present

## 2019-07-20 DIAGNOSIS — M5442 Lumbago with sciatica, left side: Secondary | ICD-10-CM | POA: Diagnosis not present

## 2019-07-20 DIAGNOSIS — M6080 Other myositis, unspecified site: Secondary | ICD-10-CM | POA: Diagnosis not present

## 2019-07-20 DIAGNOSIS — M62830 Muscle spasm of back: Secondary | ICD-10-CM | POA: Diagnosis not present

## 2019-07-20 DIAGNOSIS — M9905 Segmental and somatic dysfunction of pelvic region: Secondary | ICD-10-CM | POA: Diagnosis not present

## 2019-07-20 DIAGNOSIS — M9903 Segmental and somatic dysfunction of lumbar region: Secondary | ICD-10-CM | POA: Diagnosis not present

## 2019-07-20 DIAGNOSIS — M546 Pain in thoracic spine: Secondary | ICD-10-CM | POA: Diagnosis not present

## 2019-07-20 DIAGNOSIS — M955 Acquired deformity of pelvis: Secondary | ICD-10-CM | POA: Diagnosis not present

## 2019-07-20 DIAGNOSIS — M9902 Segmental and somatic dysfunction of thoracic region: Secondary | ICD-10-CM | POA: Diagnosis not present

## 2019-07-26 DIAGNOSIS — M461 Sacroiliitis, not elsewhere classified: Secondary | ICD-10-CM | POA: Diagnosis not present

## 2019-07-26 DIAGNOSIS — M5442 Lumbago with sciatica, left side: Secondary | ICD-10-CM | POA: Diagnosis not present

## 2019-07-26 DIAGNOSIS — M9903 Segmental and somatic dysfunction of lumbar region: Secondary | ICD-10-CM | POA: Diagnosis not present

## 2019-07-26 DIAGNOSIS — M6080 Other myositis, unspecified site: Secondary | ICD-10-CM | POA: Diagnosis not present

## 2019-07-26 DIAGNOSIS — M9905 Segmental and somatic dysfunction of pelvic region: Secondary | ICD-10-CM | POA: Diagnosis not present

## 2019-07-26 DIAGNOSIS — M62830 Muscle spasm of back: Secondary | ICD-10-CM | POA: Diagnosis not present

## 2019-07-26 DIAGNOSIS — M955 Acquired deformity of pelvis: Secondary | ICD-10-CM | POA: Diagnosis not present

## 2019-07-26 DIAGNOSIS — M9902 Segmental and somatic dysfunction of thoracic region: Secondary | ICD-10-CM | POA: Diagnosis not present

## 2019-07-26 DIAGNOSIS — M546 Pain in thoracic spine: Secondary | ICD-10-CM | POA: Diagnosis not present

## 2019-08-12 DIAGNOSIS — Z20822 Contact with and (suspected) exposure to covid-19: Secondary | ICD-10-CM | POA: Diagnosis not present

## 2019-08-15 DIAGNOSIS — M9905 Segmental and somatic dysfunction of pelvic region: Secondary | ICD-10-CM | POA: Diagnosis not present

## 2019-08-15 DIAGNOSIS — M9902 Segmental and somatic dysfunction of thoracic region: Secondary | ICD-10-CM | POA: Diagnosis not present

## 2019-08-15 DIAGNOSIS — M62830 Muscle spasm of back: Secondary | ICD-10-CM | POA: Diagnosis not present

## 2019-08-15 DIAGNOSIS — M6080 Other myositis, unspecified site: Secondary | ICD-10-CM | POA: Diagnosis not present

## 2019-08-15 DIAGNOSIS — M955 Acquired deformity of pelvis: Secondary | ICD-10-CM | POA: Diagnosis not present

## 2019-08-15 DIAGNOSIS — M461 Sacroiliitis, not elsewhere classified: Secondary | ICD-10-CM | POA: Diagnosis not present

## 2019-08-15 DIAGNOSIS — M5442 Lumbago with sciatica, left side: Secondary | ICD-10-CM | POA: Diagnosis not present

## 2019-08-15 DIAGNOSIS — M9903 Segmental and somatic dysfunction of lumbar region: Secondary | ICD-10-CM | POA: Diagnosis not present

## 2019-08-15 DIAGNOSIS — M546 Pain in thoracic spine: Secondary | ICD-10-CM | POA: Diagnosis not present

## 2019-08-19 DIAGNOSIS — M955 Acquired deformity of pelvis: Secondary | ICD-10-CM | POA: Diagnosis not present

## 2019-08-19 DIAGNOSIS — M9902 Segmental and somatic dysfunction of thoracic region: Secondary | ICD-10-CM | POA: Diagnosis not present

## 2019-08-19 DIAGNOSIS — M9903 Segmental and somatic dysfunction of lumbar region: Secondary | ICD-10-CM | POA: Diagnosis not present

## 2019-08-19 DIAGNOSIS — M546 Pain in thoracic spine: Secondary | ICD-10-CM | POA: Diagnosis not present

## 2019-08-19 DIAGNOSIS — M6080 Other myositis, unspecified site: Secondary | ICD-10-CM | POA: Diagnosis not present

## 2019-08-19 DIAGNOSIS — M62830 Muscle spasm of back: Secondary | ICD-10-CM | POA: Diagnosis not present

## 2019-08-19 DIAGNOSIS — M5442 Lumbago with sciatica, left side: Secondary | ICD-10-CM | POA: Diagnosis not present

## 2019-08-19 DIAGNOSIS — M461 Sacroiliitis, not elsewhere classified: Secondary | ICD-10-CM | POA: Diagnosis not present

## 2019-08-19 DIAGNOSIS — M9905 Segmental and somatic dysfunction of pelvic region: Secondary | ICD-10-CM | POA: Diagnosis not present

## 2019-08-25 DIAGNOSIS — M9902 Segmental and somatic dysfunction of thoracic region: Secondary | ICD-10-CM | POA: Diagnosis not present

## 2019-08-25 DIAGNOSIS — M9905 Segmental and somatic dysfunction of pelvic region: Secondary | ICD-10-CM | POA: Diagnosis not present

## 2019-08-25 DIAGNOSIS — M955 Acquired deformity of pelvis: Secondary | ICD-10-CM | POA: Diagnosis not present

## 2019-08-25 DIAGNOSIS — M6080 Other myositis, unspecified site: Secondary | ICD-10-CM | POA: Diagnosis not present

## 2019-08-25 DIAGNOSIS — M5442 Lumbago with sciatica, left side: Secondary | ICD-10-CM | POA: Diagnosis not present

## 2019-08-25 DIAGNOSIS — M461 Sacroiliitis, not elsewhere classified: Secondary | ICD-10-CM | POA: Diagnosis not present

## 2019-08-25 DIAGNOSIS — M546 Pain in thoracic spine: Secondary | ICD-10-CM | POA: Diagnosis not present

## 2019-08-25 DIAGNOSIS — M9903 Segmental and somatic dysfunction of lumbar region: Secondary | ICD-10-CM | POA: Diagnosis not present

## 2019-08-25 DIAGNOSIS — M62830 Muscle spasm of back: Secondary | ICD-10-CM | POA: Diagnosis not present

## 2019-09-13 DIAGNOSIS — M62830 Muscle spasm of back: Secondary | ICD-10-CM | POA: Diagnosis not present

## 2019-09-13 DIAGNOSIS — M546 Pain in thoracic spine: Secondary | ICD-10-CM | POA: Diagnosis not present

## 2019-09-13 DIAGNOSIS — M955 Acquired deformity of pelvis: Secondary | ICD-10-CM | POA: Diagnosis not present

## 2019-09-13 DIAGNOSIS — M6080 Other myositis, unspecified site: Secondary | ICD-10-CM | POA: Diagnosis not present

## 2019-09-13 DIAGNOSIS — M9902 Segmental and somatic dysfunction of thoracic region: Secondary | ICD-10-CM | POA: Diagnosis not present

## 2019-09-13 DIAGNOSIS — M5442 Lumbago with sciatica, left side: Secondary | ICD-10-CM | POA: Diagnosis not present

## 2019-09-13 DIAGNOSIS — M9905 Segmental and somatic dysfunction of pelvic region: Secondary | ICD-10-CM | POA: Diagnosis not present

## 2019-09-13 DIAGNOSIS — M9903 Segmental and somatic dysfunction of lumbar region: Secondary | ICD-10-CM | POA: Diagnosis not present

## 2019-09-13 DIAGNOSIS — M461 Sacroiliitis, not elsewhere classified: Secondary | ICD-10-CM | POA: Diagnosis not present

## 2019-10-03 ENCOUNTER — Telehealth: Payer: Self-pay | Admitting: Family Medicine

## 2019-10-03 NOTE — Telephone Encounter (Unsigned)
Copied from [facility] [place] [phone]. Topic: General - Other >> Oct 03, 2019  8:01 AM Oneta Rack wrote: Reason for CRM: patient unsure if she should keep her 8/5 appt with PCP to discuss breast pain or follow up with GYN, patient would like a response thru mychart since she is at work

## 2019-10-03 NOTE — Telephone Encounter (Signed)
Sent patient a my chart message informing it may be best for her to see GYN sooner.  CM

## 2019-10-06 ENCOUNTER — Encounter: Payer: Self-pay | Admitting: Family Medicine

## 2019-10-06 ENCOUNTER — Ambulatory Visit: Payer: 59 | Admitting: Family Medicine

## 2019-10-06 ENCOUNTER — Other Ambulatory Visit: Payer: Self-pay

## 2019-10-06 VITALS — BP 122/96 | HR 97 | Ht 63.0 in | Wt 190.0 lb

## 2019-10-06 DIAGNOSIS — M461 Sacroiliitis, not elsewhere classified: Secondary | ICD-10-CM | POA: Diagnosis not present

## 2019-10-06 DIAGNOSIS — N6310 Unspecified lump in the right breast, unspecified quadrant: Secondary | ICD-10-CM

## 2019-10-06 DIAGNOSIS — M955 Acquired deformity of pelvis: Secondary | ICD-10-CM | POA: Diagnosis not present

## 2019-10-06 DIAGNOSIS — M62830 Muscle spasm of back: Secondary | ICD-10-CM | POA: Diagnosis not present

## 2019-10-06 DIAGNOSIS — M6080 Other myositis, unspecified site: Secondary | ICD-10-CM | POA: Diagnosis not present

## 2019-10-06 DIAGNOSIS — M5442 Lumbago with sciatica, left side: Secondary | ICD-10-CM | POA: Diagnosis not present

## 2019-10-06 DIAGNOSIS — M546 Pain in thoracic spine: Secondary | ICD-10-CM | POA: Diagnosis not present

## 2019-10-06 DIAGNOSIS — M9902 Segmental and somatic dysfunction of thoracic region: Secondary | ICD-10-CM | POA: Diagnosis not present

## 2019-10-06 DIAGNOSIS — M9903 Segmental and somatic dysfunction of lumbar region: Secondary | ICD-10-CM | POA: Diagnosis not present

## 2019-10-06 DIAGNOSIS — M9905 Segmental and somatic dysfunction of pelvic region: Secondary | ICD-10-CM | POA: Diagnosis not present

## 2019-10-06 NOTE — Progress Notes (Signed)
Date:  10/06/2019   Name:  Katrina Campos   DOB:  March 29, 1995   MRN:  [id number]   Chief Complaint: Breast Mass (X1 week, painful, right mid breast under nipple, hard, clump, can only feel it when pressing down)  Patient is a 24 year old female who presents for a breast pain/ mass exam. The patient reports the following problems: . Health maintenance has been reviewed up to date.   Lab Results  Component Value Date   CREATININE 0.77 05/13/2017   BUN 13 05/13/2017   NA 135 05/13/2017   K 3.5 05/13/2017   CL 104 05/13/2017   CO2 20 (L) 05/13/2017   Lab Results  Component Value Date   CHOL 167 05/29/2017   HDL 37 (L) 05/29/2017   LDLCALC 120 (H) 05/29/2017   TRIG 50 05/29/2017   Lab Results  Component Value Date   TSH 1.08 04/28/2016   Lab Results  Component Value Date   HGBA1C 5.3 04/28/2016   Lab Results  Component Value Date   WBC 6.4 04/28/2016   HGB 13.7 04/28/2016   HCT 40.0 04/28/2016   MCV 90.5 04/28/2016   PLT 283.0 04/28/2016   Lab Results  Component Value Date   ALT 99 (H) 05/29/2017   AST 81 (H) 05/29/2017   ALKPHOS 66 05/29/2017   BILITOT 0.5 05/29/2017     Review of Systems  Constitutional: Negative.  Negative for chills, fatigue, fever and unexpected weight change.  HENT: Negative for congestion, ear discharge, ear pain, rhinorrhea, sinus pressure, sneezing and sore throat.   Eyes: Negative for photophobia, pain, discharge, redness and itching.  Respiratory: Negative for cough, shortness of breath, wheezing and stridor.   Gastrointestinal: Negative for abdominal pain, blood in stool, constipation, diarrhea, nausea and vomiting.  Endocrine: Negative for cold intolerance, heat intolerance, polydipsia, polyphagia and polyuria.  Genitourinary: Negative for dysuria, flank pain, frequency, hematuria, menstrual problem, pelvic pain, urgency, vaginal bleeding and vaginal discharge.  Musculoskeletal: Negative for arthralgias, back pain and myalgias.    Skin: Negative for rash.  Allergic/Immunologic: Negative for environmental allergies and food allergies.  Neurological: Negative for dizziness, weakness, light-headedness, numbness and headaches.  Hematological: Negative for adenopathy. Does not bruise/bleed easily.  Psychiatric/Behavioral: Negative for dysphoric mood. The patient is not nervous/anxious.     Patient Active Problem List   Diagnosis Date Noted  . Hx of cardiac murmur 03/01/2019  . Anxiety 06/08/2016  . Palpitations 04/28/2016  . Obesity 12/11/2015    No Known Allergies  Past Surgical History:  Procedure Laterality Date  . TONSILLECTOMY N/A 01/21/2018   Procedure: TONSILLECTOMY;  Surgeon: Margaretha Sheffield, MD;  Location: [place];  Service: ENT;  Laterality: N/A;  . tubes in ears      Social History   Tobacco Use  . Smoking status: Never Smoker  . Smokeless tobacco: Never Used  Vaping Use  . Vaping Use: Former  . Quit date: 08/02/2015  Substance Use Topics  . Alcohol use: Yes    Comment: socially - 1x/mo  . Drug use: No     Medication list has been reviewed and updated.  Current Meds  Medication Sig  . amitriptyline (ELAVIL) 25 MG tablet TAKE 1 TABLET BY MOUTH EVERYDAY AT BEDTIME  . etonogestrel-ethinyl estradiol (NUVARING) 0.12-0.015 MG/24HR vaginal ring INSERT 1 RING VAGINALLY AS DIRECTED. REMOVE AFTER 3 WEEKS & WAIT 7 DAYS BEFORE INSERTING A NEW RING  . hyoscyamine (LEVSIN) 0.125 MG tablet Take 1 tablet (0.125 mg total) by  mouth every 4 (four) hours as needed. (Patient taking differently: Take 0.125 mg by mouth every 4 (four) hours as needed. GI)  . ondansetron (ZOFRAN) 4 MG tablet Take 1 tablet (4 mg total) by mouth every 8 (eight) hours as needed for nausea or vomiting.  . sucralfate (CARAFATE) 1 g tablet Take 1 tablet (1 g total) by mouth 4 (four) times daily -  with meals and at bedtime.    PHQ 2/9 Scores 10/06/2019 10/05/2018 11/03/2017 06/09/2017  PHQ - 2 Score 0 0 0 0  PHQ- 9 Score 2 1 0 -     GAD 7 : Generalized Anxiety Score 10/06/2019  Nervous, Anxious, on Edge 1  Control/stop worrying 0  Worry too much - different things 0  Trouble relaxing 1  Restless 0  Easily annoyed or irritable 1  Afraid - awful might happen 0  Total GAD 7 Score 3  Anxiety Difficulty Not difficult at all    BP Readings from Last 3 Encounters:  10/06/19 (!) 122/96  03/17/19 120/80  03/10/19 122/78    Physical Exam Vitals and nursing note reviewed.  Constitutional:      General: She is not in acute distress.    Appearance: She is not diaphoretic.  HENT:     Head: Normocephalic and atraumatic.     Right Ear: Tympanic membrane, ear canal and external ear normal. There is no impacted cerumen.     Left Ear: Tympanic membrane, ear canal and external ear normal. There is no impacted cerumen.     Nose: Nose normal.  Eyes:     General:        Right eye: No discharge.        Left eye: No discharge.     Conjunctiva/sclera: Conjunctivae normal.     Pupils: Pupils are equal, round, and reactive to light.  Neck:     Thyroid: No thyromegaly.     Vascular: No JVD.  Cardiovascular:     Rate and Rhythm: Regular rhythm. Tachycardia present.     Heart sounds: Normal heart sounds. No murmur heard.  No friction rub. No gallop.   Pulmonary:     Effort: Pulmonary effort is normal.     Breath sounds: Normal breath sounds. No wheezing, rhonchi or rales.  Chest:     Breasts:        Right: Mass and tenderness present. No swelling, bleeding, inverted nipple, nipple discharge or skin change.        Left: Normal. No swelling, bleeding, inverted nipple, mass, nipple discharge, skin change or tenderness.     Comments: 1 cm /edge areolar/ 9 o'clock Abdominal:     General: Bowel sounds are normal.     Palpations: Abdomen is soft. There is no mass.     Tenderness: There is no abdominal tenderness. There is no guarding.  Musculoskeletal:        General: Normal range of motion.     Cervical back: Normal range  of motion and neck supple.  Lymphadenopathy:     Cervical: No cervical adenopathy.     Upper Body:     Right upper body: No supraclavicular or axillary adenopathy.     Left upper body: No supraclavicular or axillary adenopathy.  Skin:    General: Skin is warm and dry.  Neurological:     Mental Status: She is alert.     Deep Tendon Reflexes: Reflexes are normal and symmetric.     Wt Readings from Last 3 Encounters:  10/06/19 190 lb (86.2 kg)  03/17/19 201 lb (91.2 kg)  03/10/19 202 lb 12.8 oz (92 kg)    BP (!) 122/96   Pulse 97   Ht 5\' 3"  (1.6 m)   Wt 190 lb (86.2 kg)   SpO2 98%   BMI 33.66 kg/m   Assessment and Plan:   1. Breast mass, right Patient recently palpated a mass in the right breast of 1 cm diameter with slight tenderness.  If sublocated approximately 9:00 at the edge of the areola.  We will refer to imaging for ultrasound of the right breast with reference to this area and if further evaluation is necessary to proceed with as needed. - US BREAST LTD UNI RIGHT INC AXILLA; Future

## 2019-10-14 ENCOUNTER — Other Ambulatory Visit: Payer: Self-pay

## 2019-10-14 ENCOUNTER — Ambulatory Visit
Admission: RE | Admit: 2019-10-14 | Discharge: 2019-10-14 | Disposition: A | Discharge status: Home / Self Care | Payer: 59 | Source: Ambulatory Visit | Attending: Family Medicine | Admitting: Family Medicine

## 2019-10-14 DIAGNOSIS — N6310 Unspecified lump in the right breast, unspecified quadrant: Secondary | ICD-10-CM

## 2019-10-14 DIAGNOSIS — N6489 Other specified disorders of breast: Secondary | ICD-10-CM | POA: Diagnosis not present

## 2019-11-01 DIAGNOSIS — M6080 Other myositis, unspecified site: Secondary | ICD-10-CM | POA: Diagnosis not present

## 2019-11-01 DIAGNOSIS — M9903 Segmental and somatic dysfunction of lumbar region: Secondary | ICD-10-CM | POA: Diagnosis not present

## 2019-11-01 DIAGNOSIS — M546 Pain in thoracic spine: Secondary | ICD-10-CM | POA: Diagnosis not present

## 2019-11-01 DIAGNOSIS — M5442 Lumbago with sciatica, left side: Secondary | ICD-10-CM | POA: Diagnosis not present

## 2019-11-01 DIAGNOSIS — M62830 Muscle spasm of back: Secondary | ICD-10-CM | POA: Diagnosis not present

## 2019-11-01 DIAGNOSIS — M9905 Segmental and somatic dysfunction of pelvic region: Secondary | ICD-10-CM | POA: Diagnosis not present

## 2019-11-01 DIAGNOSIS — M955 Acquired deformity of pelvis: Secondary | ICD-10-CM | POA: Diagnosis not present

## 2019-11-01 DIAGNOSIS — M9902 Segmental and somatic dysfunction of thoracic region: Secondary | ICD-10-CM | POA: Diagnosis not present

## 2019-11-01 DIAGNOSIS — M461 Sacroiliitis, not elsewhere classified: Secondary | ICD-10-CM | POA: Diagnosis not present

## 2019-11-16 ENCOUNTER — Telehealth: Payer: Self-pay

## 2019-11-16 ENCOUNTER — Telehealth: Payer: Self-pay | Admitting: Certified Nurse Midwife

## 2019-11-16 NOTE — Telephone Encounter (Signed)
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 Patient claims Melody had given her at least one vaccine however she couldn't recall the name of it. Informed patient that all of her vaccines should show up in her MyChart however the patient stated that she would need a physicians signature on the form. Could you please advise?

## 2019-11-16 NOTE — Telephone Encounter (Signed)
mychart message sent to patient

## 2019-11-22 ENCOUNTER — Ambulatory Visit: Payer: 59 | Admitting: Family Medicine

## 2019-11-22 ENCOUNTER — Other Ambulatory Visit: Payer: Self-pay

## 2019-11-22 ENCOUNTER — Encounter: Payer: Self-pay | Admitting: Family Medicine

## 2019-11-22 VITALS — BP 120/60 | HR 68 | Temp 98.3°F | Resp 12 | Ht 63.0 in | Wt 189.0 lb

## 2019-11-22 DIAGNOSIS — Z020 Encounter for examination for admission to educational institution: Secondary | ICD-10-CM

## 2019-11-22 LAB — POCT URINALYSIS DIPSTICK
Bilirubin, UA: NEGATIVE
Blood, UA: NEGATIVE
Glucose, UA: NEGATIVE
Ketones, UA: NEGATIVE
Leukocytes, UA: NEGATIVE
Nitrite, UA: NEGATIVE
Protein, UA: NEGATIVE
Spec Grav, UA: 1.02 (ref 1.010–1.025)
Urobilinogen, UA: 0.2 E.U./dL
pH, UA: 6 (ref 5.0–8.0)

## 2019-11-22 NOTE — Progress Notes (Signed)
Date:  11/22/2019   Name:  Katrina Campos   DOB:  06-17-1995   MRN:  [id number]   Chief Complaint: Annual Exam  Patient is a 24 year old female who presents for a school physical exam. The patient reports the following problems: none. Health maintenance has been reviewed up to date.   Lab Results  Component Value Date   CREATININE 0.77 05/13/2017   BUN 13 05/13/2017   NA 135 05/13/2017   K 3.5 05/13/2017   CL 104 05/13/2017   CO2 20 (L) 05/13/2017   Lab Results  Component Value Date   CHOL 167 05/29/2017   HDL 37 (L) 05/29/2017   LDLCALC 120 (H) 05/29/2017   TRIG 50 05/29/2017   Lab Results  Component Value Date   TSH 1.08 04/28/2016   Lab Results  Component Value Date   HGBA1C 5.3 04/28/2016   Lab Results  Component Value Date   WBC 6.4 04/28/2016   HGB 13.7 04/28/2016   HCT 40.0 04/28/2016   MCV 90.5 04/28/2016   PLT 283.0 04/28/2016   Lab Results  Component Value Date   ALT 99 (H) 05/29/2017   AST 81 (H) 05/29/2017   ALKPHOS 66 05/29/2017   BILITOT 0.5 05/29/2017     Review of Systems  Constitutional: Negative.  Negative for chills, fatigue, fever and unexpected weight change.  HENT: Negative for congestion, ear discharge, ear pain, rhinorrhea, sinus pressure, sneezing and sore throat.   Eyes: Negative for photophobia, pain, discharge, redness and itching.  Respiratory: Negative for cough, shortness of breath, wheezing and stridor.   Cardiovascular: Negative for chest pain, palpitations and leg swelling.  Gastrointestinal: Negative for abdominal pain, blood in stool, constipation, diarrhea, nausea and vomiting.  Endocrine: Negative for cold intolerance, heat intolerance, polydipsia, polyphagia and polyuria.  Genitourinary: Negative for dysuria, flank pain, frequency, hematuria, menstrual problem, pelvic pain, urgency, vaginal bleeding and vaginal discharge.  Musculoskeletal: Negative for arthralgias, back pain and myalgias.  Skin: Negative for rash.   Allergic/Immunologic: Negative for environmental allergies and food allergies.  Neurological: Negative for dizziness, weakness, light-headedness, numbness and headaches.  Hematological: Negative for adenopathy. Does not bruise/bleed easily.  Psychiatric/Behavioral: Negative for dysphoric mood. The patient is not nervous/anxious.     Patient Active Problem List   Diagnosis Date Noted  . Hx of cardiac murmur 03/01/2019  . Anxiety 06/08/2016  . Palpitations 04/28/2016  . Obesity 12/11/2015    No Known Allergies  Past Surgical History:  Procedure Laterality Date  . TONSILLECTOMY N/A 01/21/2018   Procedure: TONSILLECTOMY;  Surgeon: Margaretha Sheffield, MD;  Location: [place];  Service: ENT;  Laterality: N/A;  . tubes in ears      Social History   Tobacco Use  . Smoking status: Never Smoker  . Smokeless tobacco: Never Used  Vaping Use  . Vaping Use: Former  . Quit date: 08/02/2015  Substance Use Topics  . Alcohol use: Yes    Comment: socially - 1x/mo  . Drug use: No     Medication list has been reviewed and updated.  Current Meds  Medication Sig  . amitriptyline (ELAVIL) 25 MG tablet TAKE 1 TABLET BY MOUTH EVERYDAY AT BEDTIME  . etonogestrel-ethinyl estradiol (NUVARING) 0.12-0.015 MG/24HR vaginal ring INSERT 1 RING VAGINALLY AS DIRECTED. REMOVE AFTER 3 WEEKS & WAIT 7 DAYS BEFORE INSERTING A NEW RING  . hyoscyamine (LEVSIN) 0.125 MG tablet Take 1 tablet (0.125 mg total) by mouth every 4 (four) hours as needed. (Patient taking  differently: Take 0.125 mg by mouth every 4 (four) hours as needed. GI)  . ondansetron (ZOFRAN) 4 MG tablet Take 1 tablet (4 mg total) by mouth every 8 (eight) hours as needed for nausea or vomiting.  . sucralfate (CARAFATE) 1 g tablet Take 1 tablet (1 g total) by mouth 4 (four) times daily -  with meals and at bedtime.    PHQ 2/9 Scores 11/22/2019 10/06/2019 10/05/2018 11/03/2017  PHQ - 2 Score 0 0 0 0  PHQ- 9 Score 0 2 1 0    GAD 7 : Generalized  Anxiety Score 11/22/2019 10/06/2019  Nervous, Anxious, on Edge 0 1  Control/stop worrying 0 0  Worry too much - different things 0 0  Trouble relaxing 0 1  Restless 0 0  Easily annoyed or irritable 0 1  Afraid - awful might happen 0 0  Total GAD 7 Score 0 3  Anxiety Difficulty - Not difficult at all    BP Readings from Last 3 Encounters:  11/22/19 120/60  10/06/19 (!) 122/96  03/17/19 120/80    Physical Exam Vitals and nursing note reviewed.  Constitutional:      General: She is not in acute distress.    Appearance: She is not diaphoretic.  HENT:     Head: Normocephalic and atraumatic.     Right Ear: Tympanic membrane, ear canal and external ear normal. There is no impacted cerumen.     Left Ear: Tympanic membrane, ear canal and external ear normal. There is no impacted cerumen.     Nose: Nose normal. No congestion or rhinorrhea.     Mouth/Throat:     Mouth: Mucous membranes are moist.  Eyes:     General:        Right eye: No discharge.        Left eye: No discharge.     Conjunctiva/sclera: Conjunctivae normal.     Pupils: Pupils are equal, round, and reactive to light.  Neck:     Thyroid: No thyromegaly.     Vascular: No JVD.  Cardiovascular:     Rate and Rhythm: Normal rate and regular rhythm.     Heart sounds: Normal heart sounds. No murmur heard.  No friction rub. No gallop.   Pulmonary:     Effort: Pulmonary effort is normal.     Breath sounds: Normal breath sounds. No wheezing, rhonchi or rales.  Chest:     Chest wall: No tenderness.  Abdominal:     General: Bowel sounds are normal.     Palpations: Abdomen is soft. There is no mass.     Tenderness: There is no abdominal tenderness. There is no right CVA tenderness, left CVA tenderness, guarding or rebound.  Musculoskeletal:        General: Normal range of motion.     Cervical back: Normal range of motion and neck supple.  Lymphadenopathy:     Cervical: No cervical adenopathy.  Skin:    General: Skin is  warm and dry.  Neurological:     Mental Status: She is alert.     Cranial Nerves: No cranial nerve deficit.     Deep Tendon Reflexes: Reflexes are normal and symmetric.     Wt Readings from Last 3 Encounters:  11/22/19 189 lb (85.7 kg)  10/06/19 190 lb (86.2 kg)  03/17/19 201 lb (91.2 kg)    BP 120/60   Pulse 68   Temp 98.3 F (36.8 C) (Oral)   Resp 12   Ht 5'  3" (1.6 m)   Wt 189 lb (85.7 kg)   BMI 33.48 kg/m   Assessment and Plan: 1. Encounter for school history and physical examination Patient presents with form for school history and physical examination.  Patient's chart was reviewed for previous encounters-most recent labs, managed most recent imaging as well as care everywhere.  Patient in the meantime we will obtain immunization records to be brought in for update.  We will hold on CBC until patient returns pending if we need to check immunization status with serum analysis. - POCT urinalysis dipstick

## 2019-11-28 ENCOUNTER — Ambulatory Visit (INDEPENDENT_AMBULATORY_CARE_PROVIDER_SITE_OTHER): Payer: 59

## 2019-11-28 DIAGNOSIS — Z020 Encounter for examination for admission to educational institution: Secondary | ICD-10-CM

## 2019-11-28 DIAGNOSIS — Z111 Encounter for screening for respiratory tuberculosis: Secondary | ICD-10-CM | POA: Diagnosis not present

## 2019-11-29 LAB — CBC WITH DIFFERENTIAL/PLATELET
Basophils Absolute: 0 10*3/uL (ref 0.0–0.2)
Basos: 0 %
EOS (ABSOLUTE): 0.1 10*3/uL (ref 0.0–0.4)
Eos: 1 %
Hematocrit: 41.8 % (ref 34.0–46.6)
Hemoglobin: 13.7 g/dL (ref 11.1–15.9)
Immature Grans (Abs): 0 10*3/uL (ref 0.0–0.1)
Immature Granulocytes: 0 %
Lymphocytes Absolute: 2.3 10*3/uL (ref 0.7–3.1)
Lymphs: 32 %
MCH: 29.8 pg (ref 26.6–33.0)
MCHC: 32.8 g/dL (ref 31.5–35.7)
MCV: 91 fL (ref 79–97)
Monocytes Absolute: 0.7 10*3/uL (ref 0.1–0.9)
Monocytes: 10 %
Neutrophils Absolute: 4.2 10*3/uL (ref 1.4–7.0)
Neutrophils: 57 %
Platelets: 300 10*3/uL (ref 150–450)
RBC: 4.6 x10E6/uL (ref 3.77–5.28)
RDW: 11.9 % (ref 11.7–15.4)
WBC: 7.3 10*3/uL (ref 3.4–10.8)

## 2019-11-30 DIAGNOSIS — M546 Pain in thoracic spine: Secondary | ICD-10-CM | POA: Diagnosis not present

## 2019-11-30 DIAGNOSIS — M62830 Muscle spasm of back: Secondary | ICD-10-CM | POA: Diagnosis not present

## 2019-11-30 DIAGNOSIS — M5442 Lumbago with sciatica, left side: Secondary | ICD-10-CM | POA: Diagnosis not present

## 2019-11-30 DIAGNOSIS — M9902 Segmental and somatic dysfunction of thoracic region: Secondary | ICD-10-CM | POA: Diagnosis not present

## 2019-11-30 DIAGNOSIS — M6080 Other myositis, unspecified site: Secondary | ICD-10-CM | POA: Diagnosis not present

## 2019-11-30 DIAGNOSIS — M9905 Segmental and somatic dysfunction of pelvic region: Secondary | ICD-10-CM | POA: Diagnosis not present

## 2019-11-30 DIAGNOSIS — M461 Sacroiliitis, not elsewhere classified: Secondary | ICD-10-CM | POA: Diagnosis not present

## 2019-11-30 DIAGNOSIS — M9903 Segmental and somatic dysfunction of lumbar region: Secondary | ICD-10-CM | POA: Diagnosis not present

## 2019-11-30 DIAGNOSIS — M955 Acquired deformity of pelvis: Secondary | ICD-10-CM | POA: Diagnosis not present

## 2019-12-01 LAB — TB SKIN TEST
Induration: 0 mm
TB Skin Test: NEGATIVE

## 2019-12-12 ENCOUNTER — Other Ambulatory Visit: Payer: Self-pay

## 2019-12-12 ENCOUNTER — Ambulatory Visit (INDEPENDENT_AMBULATORY_CARE_PROVIDER_SITE_OTHER): Payer: 59

## 2019-12-12 DIAGNOSIS — Z111 Encounter for screening for respiratory tuberculosis: Secondary | ICD-10-CM | POA: Diagnosis not present

## 2019-12-14 LAB — TB SKIN TEST
Induration: 0 mm
TB Skin Test: NEGATIVE

## 2020-01-02 DIAGNOSIS — M6080 Other myositis, unspecified site: Secondary | ICD-10-CM | POA: Diagnosis not present

## 2020-01-02 DIAGNOSIS — M461 Sacroiliitis, not elsewhere classified: Secondary | ICD-10-CM | POA: Diagnosis not present

## 2020-01-02 DIAGNOSIS — M546 Pain in thoracic spine: Secondary | ICD-10-CM | POA: Diagnosis not present

## 2020-01-02 DIAGNOSIS — M9902 Segmental and somatic dysfunction of thoracic region: Secondary | ICD-10-CM | POA: Diagnosis not present

## 2020-01-02 DIAGNOSIS — M62830 Muscle spasm of back: Secondary | ICD-10-CM | POA: Diagnosis not present

## 2020-01-02 DIAGNOSIS — M5442 Lumbago with sciatica, left side: Secondary | ICD-10-CM | POA: Diagnosis not present

## 2020-01-02 DIAGNOSIS — M955 Acquired deformity of pelvis: Secondary | ICD-10-CM | POA: Diagnosis not present

## 2020-01-02 DIAGNOSIS — M9903 Segmental and somatic dysfunction of lumbar region: Secondary | ICD-10-CM | POA: Diagnosis not present

## 2020-01-02 DIAGNOSIS — M9905 Segmental and somatic dysfunction of pelvic region: Secondary | ICD-10-CM | POA: Diagnosis not present

## 2020-02-01 DIAGNOSIS — M6080 Other myositis, unspecified site: Secondary | ICD-10-CM | POA: Diagnosis not present

## 2020-02-01 DIAGNOSIS — M461 Sacroiliitis, not elsewhere classified: Secondary | ICD-10-CM | POA: Diagnosis not present

## 2020-02-01 DIAGNOSIS — M546 Pain in thoracic spine: Secondary | ICD-10-CM | POA: Diagnosis not present

## 2020-02-01 DIAGNOSIS — M9902 Segmental and somatic dysfunction of thoracic region: Secondary | ICD-10-CM | POA: Diagnosis not present

## 2020-02-01 DIAGNOSIS — M955 Acquired deformity of pelvis: Secondary | ICD-10-CM | POA: Diagnosis not present

## 2020-02-01 DIAGNOSIS — M9903 Segmental and somatic dysfunction of lumbar region: Secondary | ICD-10-CM | POA: Diagnosis not present

## 2020-02-01 DIAGNOSIS — M9905 Segmental and somatic dysfunction of pelvic region: Secondary | ICD-10-CM | POA: Diagnosis not present

## 2020-02-01 DIAGNOSIS — M5442 Lumbago with sciatica, left side: Secondary | ICD-10-CM | POA: Diagnosis not present

## 2020-02-01 DIAGNOSIS — M62830 Muscle spasm of back: Secondary | ICD-10-CM | POA: Diagnosis not present

## 2020-03-05 ENCOUNTER — Encounter: Payer: 59 | Admitting: Certified Nurse Midwife

## 2020-03-06 ENCOUNTER — Ambulatory Visit (INDEPENDENT_AMBULATORY_CARE_PROVIDER_SITE_OTHER): Payer: 59 | Admitting: Certified Nurse Midwife

## 2020-03-06 ENCOUNTER — Other Ambulatory Visit (HOSPITAL_COMMUNITY)
Admission: RE | Admit: 2020-03-06 | Discharge: 2020-03-06 | Disposition: A | Discharge status: Home / Self Care | Payer: 59 | Source: Ambulatory Visit | Attending: Certified Nurse Midwife | Admitting: Certified Nurse Midwife

## 2020-03-06 ENCOUNTER — Encounter: Payer: Self-pay | Admitting: Certified Nurse Midwife

## 2020-03-06 ENCOUNTER — Other Ambulatory Visit: Payer: Self-pay

## 2020-03-06 VITALS — BP 100/68 | HR 87 | Ht 63.0 in | Wt 186.1 lb

## 2020-03-06 DIAGNOSIS — R102 Pelvic and perineal pain: Secondary | ICD-10-CM

## 2020-03-06 DIAGNOSIS — Z01419 Encounter for gynecological examination (general) (routine) without abnormal findings: Secondary | ICD-10-CM | POA: Insufficient documentation

## 2020-03-06 DIAGNOSIS — Z114 Encounter for screening for human immunodeficiency virus [HIV]: Secondary | ICD-10-CM | POA: Diagnosis not present

## 2020-03-06 DIAGNOSIS — N926 Irregular menstruation, unspecified: Secondary | ICD-10-CM

## 2020-03-06 DIAGNOSIS — Z1159 Encounter for screening for other viral diseases: Secondary | ICD-10-CM | POA: Diagnosis not present

## 2020-03-06 DIAGNOSIS — Z113 Encounter for screening for infections with a predominantly sexual mode of transmission: Secondary | ICD-10-CM | POA: Insufficient documentation

## 2020-03-06 LAB — POCT URINE PREGNANCY: Preg Test, Ur: NEGATIVE

## 2020-03-06 MED ORDER — ETONOGESTREL-ETHINYL ESTRADIOL 0.12-0.015 MG/24HR VA RING
VAGINAL_RING | VAGINAL | 11 refills | Status: DC
  Filled 2020-10-31 – 2020-11-28 (×2): qty 3, 84d supply, fill #0
  Filled 2021-01-16: qty 3, 84d supply, fill #1

## 2020-03-06 NOTE — Progress Notes (Signed)
GYNECOLOGY ANNUAL PREVENTATIVE CARE ENCOUNTER NOTE  History:     Katrina Campos is a 25 y.o. G0P0000 female here for a routine annual gynecologic exam.  Current complaints: pelvic pain, some changes in her menstrual cycle, new partner requesting STD testing .   Denies abnormal vaginal bleeding, discharge, pelvic pain, problems with intercourse or other gynecologic concerns.     Social Relationship:female boyfriend Living: alone Work: Equities trader, Building control surveyor, nursing school  Exercise: 2 x wk for 1.5 hr Smoke/Alcohol/drug use: occasional alcohol.   Gynecologic History Patient's last menstrual period was 01/27/2020 (exact date). Contraception: NuvaRing vaginal inserts Last Pap: 03/01/19. Results were: normal  Last mammogram: n/a    Upstream - 03/06/20 1533      Pregnancy Intention Screening   Does the patient want to become pregnant in the next year? No    Does the patient's partner want to become pregnant in the next year? No    Would the patient like to discuss contraceptive options today? No      Contraception Wrap Up   Current Method Vaginal Ring    End Method Vaginal Ring    Contraception Counseling Provided No          The pregnancy intention screening data noted above was reviewed. Potential methods of contraception were discussed. The patient elected to proceed with Vaginal Ring.    Obstetric History OB History  Gravida Para Term Preterm AB Living  0 0 0 0 0 0  SAB IAB Ectopic Multiple Live Births  0 0 0 0 0    Past Medical History:  Diagnosis Date  . Fatty liver   . GERD (gastroesophageal reflux disease)   . Heart murmur    Born with one  . IBS (irritable bowel syndrome) 01/2017    Past Surgical History:  Procedure Laterality Date  . TONSILLECTOMY N/A 01/21/2018   Procedure: TONSILLECTOMY;  Surgeon: Margaretha Sheffield, MD;  Location: [facility];  Service: ENT;  Laterality: N/A;  . tubes in ears      Current Outpatient  Medications on File Prior to Visit  Medication Sig Dispense Refill  . amitriptyline (ELAVIL) 25 MG tablet TAKE 1 TABLET BY MOUTH EVERYDAY AT BEDTIME 90 tablet 1  . etonogestrel-ethinyl estradiol (NUVARING) 0.12-0.015 MG/24HR vaginal ring INSERT 1 RING VAGINALLY AS DIRECTED. REMOVE AFTER 3 WEEKS & WAIT 7 DAYS BEFORE INSERTING A NEW RING 1 each 11  . hyoscyamine (LEVSIN) 0.125 MG tablet Take 1 tablet (0.125 mg total) by mouth every 4 (four) hours as needed. (Patient taking differently: Take 0.125 mg by mouth every 4 (four) hours as needed. GI) 30 tablet 1  . ondansetron (ZOFRAN) 4 MG tablet Take 1 tablet (4 mg total) by mouth every 8 (eight) hours as needed for nausea or vomiting. 30 tablet 6  . sucralfate (CARAFATE) 1 g tablet Take 1 tablet (1 g total) by mouth 4 (four) times daily -  with meals and at bedtime. 60 tablet 1   No current facility-administered medications on file prior to visit.    No Known Allergies  Social History:  reports that she has never smoked. She has never used smokeless tobacco. She reports current alcohol use. She reports that she does not use drugs.  Family History  Problem Relation Age of Onset  . Diabetes Paternal Grandfather   . Hyperlipidemia Father   . Diabetes Father   . Heart disease Maternal Grandfather   . Sudden death Maternal Grandfather  The following portions of the patient's history were reviewed and updated as appropriate: allergies, current medications, past family history, past medical history, past social history, past surgical history and problem list.  Review of Systems Pertinent items noted in HPI and remainder of comprehensive ROS otherwise negative.  Physical Exam:  BP 100/68   Pulse 87   Ht 5\' 3"  (1.6 m)   Wt 186 lb 2 oz (84.4 kg)   LMP 01/27/2020 (Exact Date)   BMI 32.97 kg/m  CONSTITUTIONAL: Well-developed, well-nourished female in no acute distress.  HENT:  Normocephalic, atraumatic, External right and left ear normal.  Oropharynx is clear and moist EYES: Conjunctivae and EOM are normal. Pupils are equal, round, and reactive to light. No scleral icterus.  NECK: Normal range of motion, supple, no masses.  Normal thyroid.  SKIN: Skin is warm and dry. No rash noted. Not diaphoretic. No erythema. No pallor. MUSCULOSKELETAL: Normal range of motion. No tenderness.  No cyanosis, clubbing, or edema.  2+ distal pulses. NEUROLOGIC: Alert and oriented to person, place, and time. Normal reflexes, muscle tone coordination.  PSYCHIATRIC: Normal mood and affect. Normal behavior. Normal judgment and thought content. CARDIOVASCULAR: Normal heart rate noted, regular rhythm RESPIRATORY: Clear to auscultation bilaterally. Effort and breath sounds normal, no problems with respiration noted. BREASTS: Symmetric in size. No masses, tenderness, skin changes, nipple drainage, or lymphadenopathy bilaterally.  ABDOMEN: Soft, no distention noted.  No tenderness, rebound or guarding.  PELVIC: Normal appearing external genitalia and urethral meatus; normal appearing vaginal mucosa and cervix.  No abnormal discharge noted.  Pap smear not indicated.  Normal uterine size, no other palpable masses, no uterine or adnexal tenderness.  .   Assessment and Plan:   annual GYN exam  Pap: n/a Mammogram : n/a Labs: hep c, hiv, vag swab , u/s pelvic ordered Refills:nuva ring Referral:none Routine preventative health maintenance measures emphasized. Please refer to After Visit Summary for other counseling recommendations.      01/29/2020, CNM [facility],  [facility]

## 2020-03-06 NOTE — Patient Instructions (Signed)
Preventive Care 23-23 Years Old, Female Preventive care refers to visits with your health care provider and lifestyle choices that can promote health and wellness. This includes:  A yearly physical exam. This may also be called an annual well check.  Regular dental visits and eye exams.  Immunizations.  Screening for certain conditions.  Healthy lifestyle choices, such as eating a healthy diet, getting regular exercise, not using drugs or products that contain nicotine and tobacco, and limiting alcohol use. What can I expect for my preventive care visit? Physical exam Your health care provider will check your:  Height and weight. This may be used to calculate body mass index (BMI), which tells if you are at a healthy weight.  Heart rate and blood pressure.  Skin for abnormal spots. Counseling Your health care provider may ask you questions about your:  Alcohol, tobacco, and drug use.  Emotional well-being.  Home and relationship well-being.  Sexual activity.  Eating habits.  Work and work Statistician.  Method of birth control.  Menstrual cycle.  Pregnancy history. What immunizations do I need?  Influenza (flu) vaccine  This is recommended every year. Tetanus, diphtheria, and pertussis (Tdap) vaccine  You may need a Td booster every 10 years. Varicella (chickenpox) vaccine  You may need this if you have not been vaccinated. Human papillomavirus (HPV) vaccine  If recommended by your health care provider, you may need three doses over 6 months. Measles, mumps, and rubella (MMR) vaccine  You may need at least one dose of MMR. You may also need a second dose. Meningococcal conjugate (MenACWY) vaccine  One dose is recommended if you are age 23-23 years and a first-year college student living in a residence hall, or if you have one of several medical conditions. You may also need additional booster doses. Pneumococcal conjugate (PCV13) vaccine  You may need  this if you have certain conditions and were not previously vaccinated. Pneumococcal polysaccharide (PPSV23) vaccine  You may need one or two doses if you smoke cigarettes or if you have certain conditions. Hepatitis A vaccine  You may need this if you have certain conditions or if you travel or work in places where you may be exposed to hepatitis A. Hepatitis B vaccine  You may need this if you have certain conditions or if you travel or work in places where you may be exposed to hepatitis B. Haemophilus influenzae type b (Hib) vaccine  You may need this if you have certain conditions. You may receive vaccines as individual doses or as more than one vaccine together in one shot (combination vaccines). Talk with your health care provider about the risks and benefits of combination vaccines. What tests do I need?  Blood tests  Lipid and cholesterol levels. These may be checked every 5 years starting at age 35.  Hepatitis C test.  Hepatitis B test. Screening  Diabetes screening. This is done by checking your blood sugar (glucose) after you have not eaten for a while (fasting).  Sexually transmitted disease (STD) testing.  BRCA-related cancer screening. This may be done if you have a family history of breast, ovarian, tubal, or peritoneal cancers.  Pelvic exam and Pap test. This may be done every 3 years starting at age 40. Starting at age 74, this may be done every 5 years if you have a Pap test in combination with an HPV test. Talk with your health care provider about your test results, treatment options, and if necessary, the need for more tests.  Follow these instructions at home: Eating and drinking   Eat a diet that includes fresh fruits and vegetables, whole grains, lean protein, and low-fat dairy.  Take vitamin and mineral supplements as recommended by your health care provider.  Do not drink alcohol if: ? Your health care provider tells you not to drink. ? You are  pregnant, may be pregnant, or are planning to become pregnant.  If you drink alcohol: ? Limit how much you have to 0-1 drink a day. ? Be aware of how much alcohol is in your drink. In the [place] one drink equals one 12 oz bottle of beer (355 mL), one 5 oz glass of wine (148 mL), or one 1 oz glass of hard liquor (44 mL). Lifestyle  Take daily care of your teeth and gums.  Stay active. Exercise for at least 30 minutes on 5 or more days each week.  Do not use any products that contain nicotine or tobacco, such as cigarettes, e-cigarettes, and chewing tobacco. If you need help quitting, ask your health care provider.  If you are sexually active, practice safe sex. Use a condom or other form of birth control (contraception) in order to prevent pregnancy and STIs (sexually transmitted infections). If you plan to become pregnant, see your health care provider for a preconception visit. What's next?  Visit your health care provider once a year for a well check visit.  Ask your health care provider how often you should have your eyes and teeth checked.  Stay up to date on all vaccines. This information is not intended to replace advice given to you by your health care provider. Make sure you discuss any questions you have with your health care provider. Document Revised: 10/29/2017 Document Reviewed: 10/29/2017 Elsevier Patient Education  2020 [employer].

## 2020-03-07 LAB — HEPATITIS C ANTIBODY: Hep C Virus Ab: <0.1 s/co ratio (ref 0.0–0.9)

## 2020-03-07 LAB — HIV ANTIBODY (ROUTINE TESTING W REFLEX): HIV Screen 4th Generation wRfx: NONREACTIVE

## 2020-03-08 LAB — CERVICOVAGINAL ANCILLARY ONLY
Bacterial Vaginitis (gardnerella): NEGATIVE
Candida Glabrata: NEGATIVE
Candida Vaginitis: POSITIVE — AB
Chlamydia: NEGATIVE
Comment: NEGATIVE
Comment: NEGATIVE
Comment: NEGATIVE
Comment: NEGATIVE
Comment: NEGATIVE
Comment: NORMAL
Neisseria Gonorrhea: NEGATIVE
Trichomonas: NEGATIVE

## 2020-03-08 LAB — URINE CULTURE

## 2020-03-09 ENCOUNTER — Other Ambulatory Visit: Payer: Self-pay | Admitting: Certified Nurse Midwife

## 2020-03-09 MED ORDER — FLUCONAZOLE 150 MG PO TABS: 150.0000 mg | ORAL_TABLET | Freq: Once | ORAL | 0 refills | Status: AC

## 2020-04-17 IMAGING — CR LEFT FOOT - COMPLETE 3+ VIEW
3 series · 3 of 3 positions shown · non-contrast
Comparison: None.

CLINICAL DATA: Fall, twisted foot

EXAM:
LEFT FOOT - COMPLETE 3+ VIEW

[foot ap]
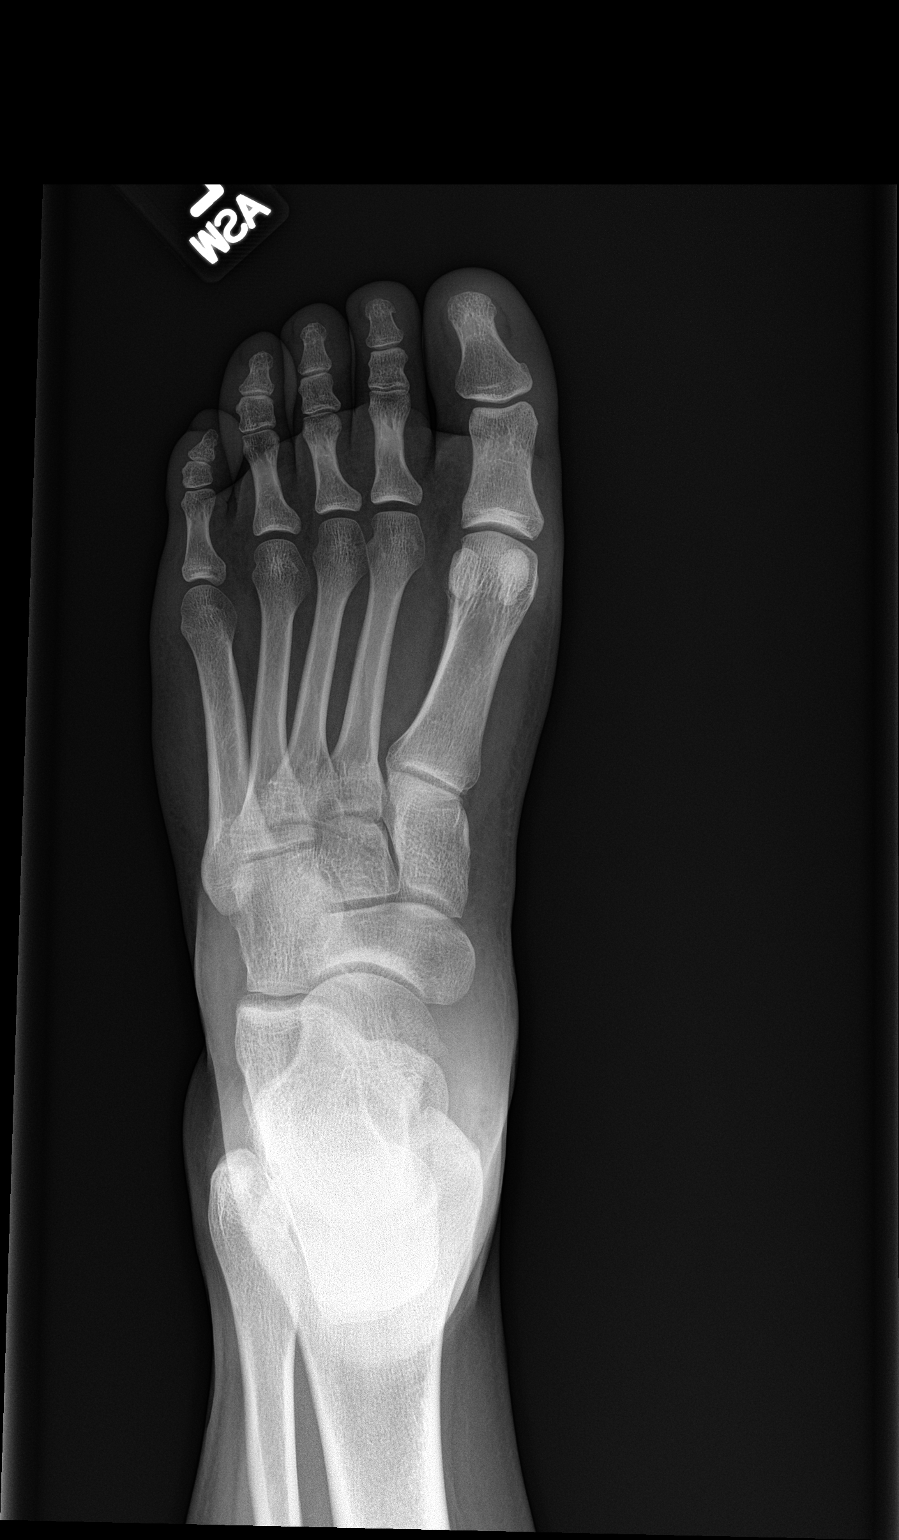

[foot obl]
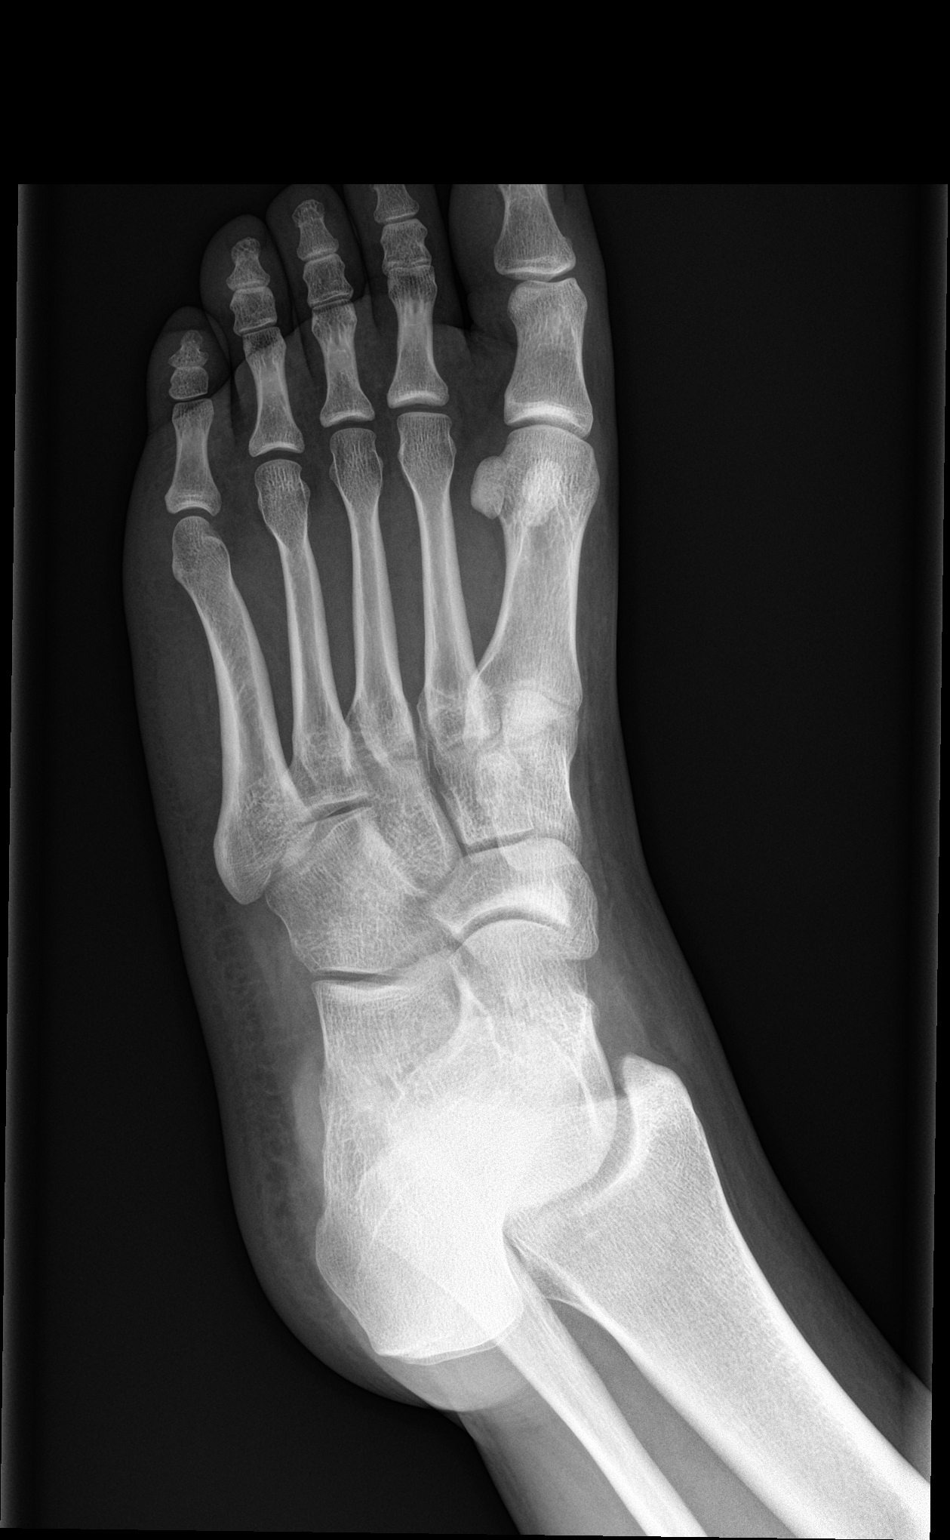

[foot lat]
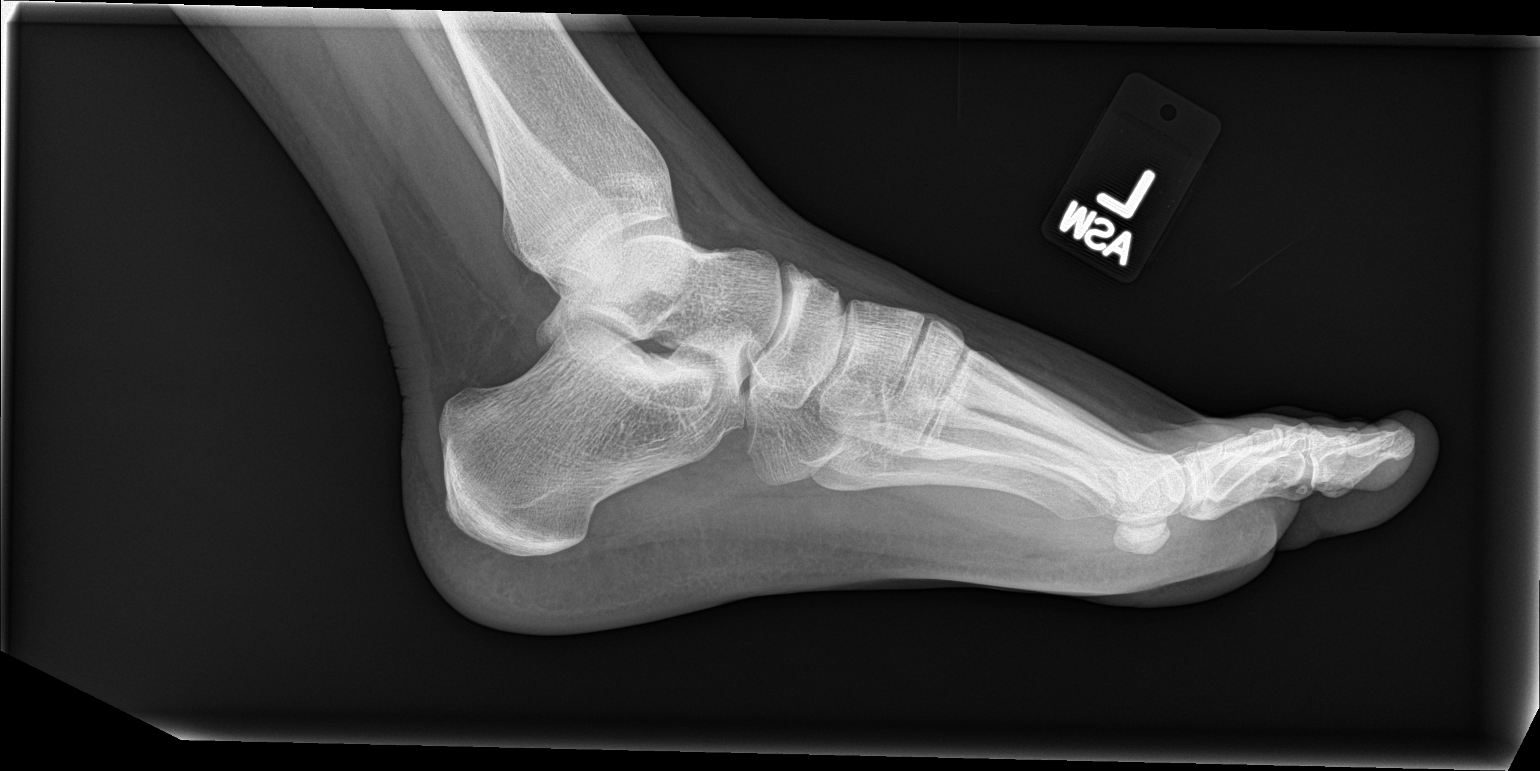

[3 of 3 positions shown; findings below may reference images not displayed]

FINDINGS: There is no evidence of fracture or dislocation. There is no
evidence of arthropathy or other focal bone abnormality. Soft
tissues are unremarkable.
IMPRESSION: Negative.

## 2020-07-10 ENCOUNTER — Encounter: Payer: Self-pay | Admitting: Family Medicine

## 2020-08-09 ENCOUNTER — Ambulatory Visit: Payer: 59 | Admitting: Family Medicine

## 2020-08-09 ENCOUNTER — Encounter: Payer: Self-pay | Admitting: Family Medicine

## 2020-08-09 ENCOUNTER — Other Ambulatory Visit: Payer: Self-pay

## 2020-08-09 VITALS — BP 120/64 | HR 64 | Ht 63.0 in | Wt 189.0 lb

## 2020-08-09 DIAGNOSIS — F329 Major depressive disorder, single episode, unspecified: Secondary | ICD-10-CM | POA: Diagnosis not present

## 2020-08-09 DIAGNOSIS — F419 Anxiety disorder, unspecified: Secondary | ICD-10-CM

## 2020-08-09 MED ORDER — SERTRALINE HCL 25 MG PO TABS: 25.0000 mg | ORAL_TABLET | Freq: Every day | ORAL | 3 refills | Status: DC

## 2020-08-09 MED ORDER — ALPRAZOLAM 0.25 MG PO TABS: 0.2500 mg | ORAL_TABLET | Freq: Two times a day (BID) | ORAL | 0 refills | Status: DC | PRN

## 2020-08-09 NOTE — Progress Notes (Signed)
Date:  08/09/2020   Name:  Katrina Campos   DOB:  Apr 27, 1995   MRN:  [id number]   Chief Complaint: Anxiety (18 and 10- was taking Amitryptiline at the first of the year- was not helping. Wants referral to [facility])  Anxiety Presents for initial visit. Onset was 6 to 12 months ago (past year). The problem has been gradually improving. Symptoms include decreased concentration, depressed mood, excessive worry, nervous/anxious behavior, panic and restlessness. Patient reports no chest pain, compulsions, confusion, dizziness, dry mouth, feeling of choking, hyperventilation, impotence, insomnia, irritability, malaise, muscle tension, nausea, obsessions, palpitations, shortness of breath or suicidal ideas. Primary symptoms comment: anorexia. Symptoms occur occasionally. The severity of symptoms is moderate. The symptoms are aggravated by social activities. The quality of sleep is poor.   Treatments tried: amitryptiline. The treatment provided no relief.   Lab Results  Component Value Date   CREATININE 0.77 05/13/2017   BUN 13 05/13/2017   NA 135 05/13/2017   K 3.5 05/13/2017   CL 104 05/13/2017   CO2 20 (L) 05/13/2017   Lab Results  Component Value Date   CHOL 167 05/29/2017   HDL 37 (L) 05/29/2017   LDLCALC 120 (H) 05/29/2017   TRIG 50 05/29/2017   Lab Results  Component Value Date   TSH 1.08 04/28/2016   Lab Results  Component Value Date   HGBA1C 5.3 04/28/2016   Lab Results  Component Value Date   WBC 7.3 11/28/2019   HGB 13.7 11/28/2019   HCT 41.8 11/28/2019   MCV 91 11/28/2019   PLT 300 11/28/2019   Lab Results  Component Value Date   ALT 99 (H) 05/29/2017   AST 81 (H) 05/29/2017   ALKPHOS 66 05/29/2017   BILITOT 0.5 05/29/2017     Review of Systems  Constitutional:  Positive for appetite change and fatigue. Negative for chills, fever and irritability.  HENT:  Negative for drooling, ear discharge, ear pain and sore throat.   Respiratory:  Negative for cough,  shortness of breath and wheezing.   Cardiovascular:  Negative for chest pain, palpitations and leg swelling.  Gastrointestinal:  Negative for abdominal pain, blood in stool, constipation, diarrhea and nausea.  Endocrine: Negative for polydipsia.  Genitourinary:  Negative for dysuria, frequency, hematuria, impotence and urgency.  Musculoskeletal:  Negative for back pain, myalgias and neck pain.  Skin:  Negative for rash.  Allergic/Immunologic: Negative for environmental allergies.  Neurological:  Negative for dizziness and headaches.  Hematological:  Does not bruise/bleed easily.  Psychiatric/Behavioral:  Positive for decreased concentration. Negative for confusion and suicidal ideas. The patient is nervous/anxious. The patient does not have insomnia.    Patient Active Problem List   Diagnosis Date Noted   Hx of cardiac murmur 03/01/2019   Anxiety 06/08/2016   Palpitations 04/28/2016   Obesity 12/11/2015    No Known Allergies  Past Surgical History:  Procedure Laterality Date   TONSILLECTOMY N/A 01/21/2018   Procedure: TONSILLECTOMY;  Surgeon: Margaretha Sheffield, MD;  Location: [facility];  Service: ENT;  Laterality: N/A;   tubes in ears      Social History   Tobacco Use   Smoking status: Never   Smokeless tobacco: Never  Vaping Use   Vaping Use: Former   Quit date: 08/02/2015  Substance Use Topics   Alcohol use: Yes    Comment: socially - 1x/mo   Drug use: No     Medication list has been reviewed and updated.  Current Meds  Medication  Sig   etonogestrel-ethinyl estradiol (NUVARING) 0.12-0.015 MG/24HR vaginal ring INSERT 1 RING VAGINALLY AS DIRECTED. REMOVE AFTER 3 WEEKS & WAIT 7 DAYS BEFORE INSERTING A NEW RING   ondansetron (ZOFRAN) 4 MG tablet Take 1 tablet (4 mg total) by mouth every 8 (eight) hours as needed for nausea or vomiting.    PHQ 2/9 Scores 08/09/2020 11/22/2019 10/06/2019 10/05/2018  PHQ - 2 Score 5 0 0 0  PHQ- 9 Score 16 0 2 1    GAD 7 : Generalized  Anxiety Score 08/09/2020 11/22/2019 10/06/2019  Nervous, Anxious, on Edge 2 0 1  Control/stop worrying 0 0 0  Worry too much - different things 2 0 0  Trouble relaxing 2 0 1  Restless 0 0 0  Easily annoyed or irritable 2 0 1  Afraid - awful might happen 0 0 0  Total GAD 7 Score 8 0 3  Anxiety Difficulty Not difficult at all - Not difficult at all    BP Readings from Last 3 Encounters:  08/09/20 120/64  03/06/20 100/68  11/22/19 120/60    Physical Exam Vitals and nursing note reviewed.  Constitutional:      General: She is not in acute distress.    Appearance: She is not diaphoretic.  HENT:     Head: Normocephalic and atraumatic.     Right Ear: Tympanic membrane, ear canal and external ear normal.     Left Ear: Tympanic membrane, ear canal and external ear normal.     Nose: Nose normal.  Eyes:     General:        Right eye: No discharge.        Left eye: No discharge.     Conjunctiva/sclera: Conjunctivae normal.     Pupils: Pupils are equal, round, and reactive to light.  Neck:     Thyroid: No thyromegaly.     Vascular: No JVD.  Cardiovascular:     Rate and Rhythm: Normal rate and regular rhythm.     Heart sounds: Normal heart sounds. No murmur heard.   No friction rub. No gallop.  Pulmonary:     Effort: Pulmonary effort is normal.     Breath sounds: Normal breath sounds.  Abdominal:     General: Bowel sounds are normal.     Palpations: Abdomen is soft. There is no mass.     Tenderness: There is no abdominal tenderness. There is no guarding.  Musculoskeletal:        General: Normal range of motion.     Cervical back: Normal range of motion and neck supple.  Lymphadenopathy:     Cervical: No cervical adenopathy.  Skin:    General: Skin is warm and dry.  Neurological:     Mental Status: She is alert.     Deep Tendon Reflexes: Reflexes are normal and symmetric.    Wt Readings from Last 3 Encounters:  08/09/20 189 lb (85.7 kg)  03/06/20 186 lb 2 oz (84.4 kg)   11/22/19 189 lb (85.7 kg)    BP 120/64   Pulse 64   Ht 5\' 3"  (1.6 m)   Wt 189 lb (85.7 kg)   BMI 33.48 kg/m   Assessment and Plan:  1. Reactive depression New onset.  Persistent.  PHQ is 16.  Controlled and uncomplicated.  Patient had recent event with individual which caused emotional concerns.  We will initiate sertraline at 12.5 mg for 8 days followed by 25 mg once a day.  Hopefully we will have  her in with psychiatry for therapy and continuance and if necessary increase of medication.  Patient does not relate any evidence of self-harm behavior or thoughts. - Ambulatory referral to Psychiatry - sertraline (ZOLOFT) 25 MG tablet; Take 1 tablet (25 mg total) by mouth daily. Initiate at one half tablet daily for 8 days  Dispense: 30 tablet; Refill: 3  2. Anxiety Chronic.  Controlled.  Stable.  As noted above we will initiate sertraline.  In addition to referral to therapy patient has been given him alprazolam to be used for panic attack circumstances. - Ambulatory referral to Psychiatry - ALPRAZolam (XANAX) 0.25 MG tablet; Take 1 tablet (0.25 mg total) by mouth 2 (two) times daily as needed for anxiety.  Dispense: 10 tablet; Refill: 0 - sertraline (ZOLOFT) 25 MG tablet; Take 1 tablet (25 mg total) by mouth daily. Initiate at one half tablet daily for 8 days  Dispense: 30 tablet; Refill: 3

## 2020-08-16 ENCOUNTER — Encounter: Payer: Self-pay | Admitting: Family Medicine

## 2020-08-21 DIAGNOSIS — F411 Generalized anxiety disorder: Secondary | ICD-10-CM | POA: Diagnosis not present

## 2020-08-21 DIAGNOSIS — Z1389 Encounter for screening for other disorder: Secondary | ICD-10-CM | POA: Diagnosis not present

## 2020-08-21 DIAGNOSIS — Z79899 Other long term (current) drug therapy: Secondary | ICD-10-CM | POA: Diagnosis not present

## 2020-09-02 ENCOUNTER — Other Ambulatory Visit: Payer: Self-pay | Admitting: Family Medicine

## 2020-09-02 DIAGNOSIS — F329 Major depressive disorder, single episode, unspecified: Secondary | ICD-10-CM

## 2020-09-02 DIAGNOSIS — F419 Anxiety disorder, unspecified: Secondary | ICD-10-CM

## 2020-09-02 NOTE — Telephone Encounter (Signed)
Requested medication (s) are due for refill today: no  Requested medication (s) are on the active medication list: yes  Last refill:  08/09/20 #30 3 RF  Future visit scheduled: no  Notes to clinic:  Requesting 90 day refills. No follow up noted.   Requested Prescriptions  Pending Prescriptions Disp Refills   sertraline (ZOLOFT) 25 MG tablet [Pharmacy Med Name: SERTRALINE HCL 25 MG TABLET] 90 tablet 2    Sig: Take 1 tablet (25 mg total) by mouth daily. Initiate at one half tablet daily for 8 days      Psychiatry:  Antidepressants - SSRI Passed - 09/02/2020  1:30 PM      Passed - Valid encounter within last 6 months    Recent Outpatient Visits           3 weeks ago Reactive depression   [facility] Juline Patch, MD   9 months ago Encounter for school history and physical examination   [facility] Juline Patch, MD   11 months ago Breast mass, right   [facility] Juline Patch, MD   1 year ago Nausea   [facility] Juline Patch, MD   1 year ago Sprain of tarsal ligament of foot, initial encounter   [facility] Juline Patch, MD

## 2020-09-18 DIAGNOSIS — F411 Generalized anxiety disorder: Secondary | ICD-10-CM | POA: Diagnosis not present

## 2020-09-18 DIAGNOSIS — F900 Attention-deficit hyperactivity disorder, predominantly inattentive type: Secondary | ICD-10-CM | POA: Diagnosis not present

## 2020-10-04 DIAGNOSIS — I340 Nonrheumatic mitral (valve) insufficiency: Secondary | ICD-10-CM | POA: Diagnosis not present

## 2020-10-04 DIAGNOSIS — Z79899 Other long term (current) drug therapy: Secondary | ICD-10-CM | POA: Diagnosis not present

## 2020-10-23 DIAGNOSIS — F900 Attention-deficit hyperactivity disorder, predominantly inattentive type: Secondary | ICD-10-CM | POA: Diagnosis not present

## 2020-10-23 DIAGNOSIS — F411 Generalized anxiety disorder: Secondary | ICD-10-CM | POA: Diagnosis not present

## 2020-10-30 ENCOUNTER — Other Ambulatory Visit: Payer: Self-pay

## 2020-10-30 MED ORDER — AMPHETAMINE-DEXTROAMPHET ER 10 MG PO CP24
ORAL_CAPSULE | ORAL | 0 refills | Status: AC
  Filled 2020-10-30 – 2020-11-02 (×4): qty 60, 30d supply, fill #0

## 2020-10-31 ENCOUNTER — Other Ambulatory Visit: Payer: Self-pay

## 2020-10-31 MED ORDER — HYDROXYZINE HCL 25 MG PO TABS
ORAL_TABLET | ORAL | 0 refills | Status: DC
  Filled 2020-11-28: qty 90, 90d supply, fill #0

## 2020-10-31 MED ORDER — SERTRALINE HCL 100 MG PO TABS
ORAL_TABLET | ORAL | 1 refills | Status: DC
  Filled 2020-10-31: qty 30, 30d supply, fill #0
  Filled 2020-11-28: qty 30, 30d supply, fill #1

## 2020-11-02 ENCOUNTER — Other Ambulatory Visit: Payer: Self-pay

## 2020-11-20 ENCOUNTER — Other Ambulatory Visit: Payer: Self-pay

## 2020-11-20 DIAGNOSIS — F900 Attention-deficit hyperactivity disorder, predominantly inattentive type: Secondary | ICD-10-CM | POA: Diagnosis not present

## 2020-11-20 DIAGNOSIS — F411 Generalized anxiety disorder: Secondary | ICD-10-CM | POA: Diagnosis not present

## 2020-11-20 MED ORDER — AMPHETAMINE-DEXTROAMPHET ER 30 MG PO CP24
ORAL_CAPSULE | ORAL | 0 refills | Status: DC
  Filled 2020-11-20: qty 30, 30d supply, fill #0

## 2020-11-28 ENCOUNTER — Other Ambulatory Visit: Payer: Self-pay

## 2020-12-03 ENCOUNTER — Other Ambulatory Visit: Payer: Self-pay

## 2020-12-03 ENCOUNTER — Ambulatory Visit (INDEPENDENT_AMBULATORY_CARE_PROVIDER_SITE_OTHER): Payer: 59

## 2020-12-03 DIAGNOSIS — Z23 Encounter for immunization: Secondary | ICD-10-CM | POA: Diagnosis not present

## 2020-12-03 DIAGNOSIS — Z111 Encounter for screening for respiratory tuberculosis: Secondary | ICD-10-CM | POA: Diagnosis not present

## 2020-12-04 ENCOUNTER — Other Ambulatory Visit: Payer: Self-pay

## 2020-12-06 LAB — QUANTIFERON-TB GOLD PLUS
QuantiFERON Mitogen Value: >10 IU/mL
QuantiFERON Nil Value: 0.05 IU/mL
QuantiFERON TB1 Ag Value: 0.06 IU/mL
QuantiFERON TB2 Ag Value: 0.05 IU/mL
QuantiFERON-TB Gold Plus: NEGATIVE

## 2020-12-28 ENCOUNTER — Other Ambulatory Visit: Payer: Self-pay

## 2020-12-28 DIAGNOSIS — F900 Attention-deficit hyperactivity disorder, predominantly inattentive type: Secondary | ICD-10-CM | POA: Diagnosis not present

## 2020-12-28 DIAGNOSIS — F411 Generalized anxiety disorder: Secondary | ICD-10-CM | POA: Diagnosis not present

## 2020-12-28 MED ORDER — SERTRALINE HCL 100 MG PO TABS
ORAL_TABLET | ORAL | 2 refills | Status: DC
  Filled 2020-12-28: qty 30, 30d supply, fill #0
  Filled 2021-01-16: qty 30, fill #0
  Filled 2021-01-23: qty 30, 30d supply, fill #0
  Filled 2021-03-07: qty 30, 30d supply, fill #1
  Filled 2021-04-19: qty 30, 30d supply, fill #2

## 2020-12-28 MED ORDER — AMPHETAMINE-DEXTROAMPHET ER 30 MG PO CP24
ORAL_CAPSULE | ORAL | 0 refills | Status: DC
  Filled 2020-12-28 – 2021-01-16 (×2): qty 30, 30d supply, fill #0

## 2020-12-28 MED ORDER — HYDROXYZINE HCL 25 MG PO TABS
ORAL_TABLET | ORAL | 2 refills | Status: DC
  Filled 2020-12-28: qty 30, 30d supply, fill #0
  Filled 2021-01-09 – 2021-01-16 (×2): qty 30, fill #0
  Filled 2021-01-23 – 2021-03-07 (×2): qty 30, 30d supply, fill #0

## 2021-01-09 ENCOUNTER — Other Ambulatory Visit: Payer: Self-pay

## 2021-01-14 ENCOUNTER — Other Ambulatory Visit: Payer: Self-pay

## 2021-01-16 ENCOUNTER — Other Ambulatory Visit: Payer: Self-pay

## 2021-01-23 ENCOUNTER — Other Ambulatory Visit: Payer: Self-pay

## 2021-02-05 ENCOUNTER — Ambulatory Visit: Payer: 59

## 2021-03-07 ENCOUNTER — Other Ambulatory Visit: Payer: Self-pay

## 2021-03-08 ENCOUNTER — Other Ambulatory Visit: Payer: Self-pay

## 2021-03-11 ENCOUNTER — Encounter: Payer: 59 | Admitting: Certified Nurse Midwife

## 2021-03-11 ENCOUNTER — Other Ambulatory Visit: Payer: Self-pay

## 2021-03-12 ENCOUNTER — Other Ambulatory Visit: Payer: Self-pay

## 2021-03-12 MED ORDER — HYDROXYZINE HCL 25 MG PO TABS
ORAL_TABLET | ORAL | 2 refills | Status: DC
  Filled 2021-03-12: qty 30, 30d supply, fill #0

## 2021-03-12 MED ORDER — AMPHETAMINE-DEXTROAMPHET ER 30 MG PO CP24
ORAL_CAPSULE | ORAL | 0 refills | Status: DC
  Filled 2021-03-12: qty 30, 30d supply, fill #0

## 2021-03-12 MED ORDER — SERTRALINE HCL 100 MG PO TABS
ORAL_TABLET | ORAL | 2 refills | Status: DC
  Filled 2021-03-12: qty 30, 30d supply, fill #0

## 2021-03-19 ENCOUNTER — Other Ambulatory Visit: Payer: Self-pay

## 2021-03-19 DIAGNOSIS — F411 Generalized anxiety disorder: Secondary | ICD-10-CM | POA: Diagnosis not present

## 2021-03-19 MED ORDER — TRAZODONE HCL 50 MG PO TABS
ORAL_TABLET | ORAL | 0 refills | Status: DC
  Filled 2021-03-19: qty 30, 30d supply, fill #0

## 2021-03-25 ENCOUNTER — Ambulatory Visit: Payer: 59

## 2021-04-19 ENCOUNTER — Other Ambulatory Visit: Payer: Self-pay | Admitting: Certified Nurse Midwife

## 2021-04-19 ENCOUNTER — Other Ambulatory Visit: Payer: Self-pay

## 2021-04-19 MED ORDER — ETONOGESTREL-ETHINYL ESTRADIOL 0.12-0.015 MG/24HR VA RING
VAGINAL_RING | VAGINAL | 11 refills | Status: DC
  Filled 2021-04-19: qty 3, 84d supply, fill #0
  Filled 2021-07-18: qty 3, 84d supply, fill #1
  Filled 2021-10-10: qty 3, 84d supply, fill #2

## 2021-04-22 ENCOUNTER — Other Ambulatory Visit: Payer: Self-pay

## 2021-04-23 ENCOUNTER — Other Ambulatory Visit: Payer: Self-pay

## 2021-04-25 ENCOUNTER — Other Ambulatory Visit: Payer: Self-pay

## 2021-04-29 ENCOUNTER — Other Ambulatory Visit: Payer: Self-pay

## 2021-04-29 MED ORDER — AMPHETAMINE-DEXTROAMPHET ER 30 MG PO CP24
ORAL_CAPSULE | ORAL | 0 refills | Status: DC
  Filled 2021-04-29 – 2021-05-03 (×2): qty 30, 30d supply, fill #0

## 2021-04-29 MED ORDER — TRAZODONE HCL 50 MG PO TABS
ORAL_TABLET | ORAL | 3 refills | Status: AC
  Filled 2021-04-29: qty 30, 30d supply, fill #0
  Filled 2021-10-10: qty 30, 30d supply, fill #1

## 2021-05-03 ENCOUNTER — Other Ambulatory Visit: Payer: Self-pay

## 2021-05-06 ENCOUNTER — Other Ambulatory Visit: Payer: Self-pay

## 2021-05-06 DIAGNOSIS — F411 Generalized anxiety disorder: Secondary | ICD-10-CM | POA: Diagnosis not present

## 2021-05-06 DIAGNOSIS — F900 Attention-deficit hyperactivity disorder, predominantly inattentive type: Secondary | ICD-10-CM | POA: Diagnosis not present

## 2021-05-06 MED ORDER — SERTRALINE HCL 100 MG PO TABS
ORAL_TABLET | ORAL | 3 refills | Status: DC
  Filled 2021-05-06 – 2021-06-03 (×2): qty 30, 30d supply, fill #0

## 2021-05-06 MED ORDER — VYVANSE 60 MG PO CAPS
ORAL_CAPSULE | ORAL | 0 refills | Status: DC
  Filled 2021-05-06 – 2021-06-03 (×2): qty 30, 30d supply, fill #0

## 2021-06-03 ENCOUNTER — Other Ambulatory Visit: Payer: Self-pay

## 2021-06-03 ENCOUNTER — Encounter: Payer: Self-pay | Admitting: Pharmacist

## 2021-06-05 ENCOUNTER — Other Ambulatory Visit: Payer: Self-pay

## 2021-06-05 MED ORDER — AMPHETAMINE-DEXTROAMPHET ER 30 MG PO CP24
ORAL_CAPSULE | ORAL | 0 refills | Status: DC
  Filled 2021-06-05: qty 14, 14d supply, fill #0

## 2021-06-17 ENCOUNTER — Other Ambulatory Visit: Payer: Self-pay

## 2021-06-18 ENCOUNTER — Other Ambulatory Visit: Payer: Self-pay

## 2021-06-18 DIAGNOSIS — F900 Attention-deficit hyperactivity disorder, predominantly inattentive type: Secondary | ICD-10-CM | POA: Diagnosis not present

## 2021-06-18 DIAGNOSIS — F411 Generalized anxiety disorder: Secondary | ICD-10-CM | POA: Diagnosis not present

## 2021-06-18 MED ORDER — AMPHETAMINE-DEXTROAMPHET ER 30 MG PO CP24
30.0000 mg | ORAL_CAPSULE | Freq: Every day | ORAL | 0 refills | Status: AC
  Filled 2021-06-18 – 2021-07-09 (×2): qty 30, 30d supply, fill #0

## 2021-06-19 ENCOUNTER — Encounter: Payer: 59 | Admitting: Certified Nurse Midwife

## 2021-07-08 ENCOUNTER — Other Ambulatory Visit: Payer: Self-pay

## 2021-07-09 ENCOUNTER — Other Ambulatory Visit: Payer: Self-pay

## 2021-07-09 DIAGNOSIS — F411 Generalized anxiety disorder: Secondary | ICD-10-CM | POA: Diagnosis not present

## 2021-07-09 DIAGNOSIS — F900 Attention-deficit hyperactivity disorder, predominantly inattentive type: Secondary | ICD-10-CM | POA: Diagnosis not present

## 2021-07-09 MED ORDER — AMPHETAMINE-DEXTROAMPHETAMINE 10 MG PO TABS
ORAL_TABLET | ORAL | 0 refills | Status: DC
  Filled 2021-07-09: qty 30, 30d supply, fill #0

## 2021-07-09 MED ORDER — AMPHETAMINE-DEXTROAMPHET ER 30 MG PO CP24
30.0000 mg | ORAL_CAPSULE | Freq: Every day | ORAL | 0 refills | Status: DC
  Filled 2021-07-09 – 2021-09-13 (×2): qty 30, 30d supply, fill #0

## 2021-07-09 MED ORDER — SERTRALINE HCL 100 MG PO TABS
ORAL_TABLET | ORAL | 3 refills | Status: AC
  Filled 2021-07-09: qty 45, 30d supply, fill #0
  Filled 2021-09-13: qty 45, 30d supply, fill #1

## 2021-07-18 ENCOUNTER — Other Ambulatory Visit: Payer: Self-pay

## 2021-09-13 ENCOUNTER — Other Ambulatory Visit: Payer: Self-pay

## 2021-09-17 ENCOUNTER — Other Ambulatory Visit: Payer: Self-pay

## 2021-09-20 ENCOUNTER — Other Ambulatory Visit: Payer: Self-pay

## 2021-09-21 ENCOUNTER — Other Ambulatory Visit: Payer: Self-pay

## 2021-10-10 ENCOUNTER — Other Ambulatory Visit: Payer: Self-pay

## 2021-10-10 MED ORDER — AMPHETAMINE-DEXTROAMPHETAMINE 10 MG PO TABS
ORAL_TABLET | ORAL | 0 refills | Status: DC
  Filled 2021-10-10: qty 30, 30d supply, fill #0

## 2021-10-10 MED ORDER — AMPHETAMINE-DEXTROAMPHET ER 30 MG PO CP24
30.0000 mg | ORAL_CAPSULE | Freq: Every day | ORAL | 0 refills | Status: AC
  Filled 2022-03-12: qty 30, 30d supply, fill #0

## 2021-10-14 ENCOUNTER — Encounter: Payer: Self-pay | Admitting: Family Medicine

## 2021-10-16 ENCOUNTER — Other Ambulatory Visit: Payer: Self-pay

## 2021-10-16 DIAGNOSIS — F411 Generalized anxiety disorder: Secondary | ICD-10-CM | POA: Diagnosis not present

## 2021-10-16 DIAGNOSIS — F3289 Other specified depressive episodes: Secondary | ICD-10-CM | POA: Diagnosis not present

## 2021-10-16 DIAGNOSIS — F900 Attention-deficit hyperactivity disorder, predominantly inattentive type: Secondary | ICD-10-CM | POA: Diagnosis not present

## 2021-10-16 MED ORDER — SERTRALINE HCL 100 MG PO TABS
ORAL_TABLET | ORAL | 3 refills | Status: AC
  Filled 2021-10-16: qty 45, 30d supply, fill #0
  Filled 2021-12-02 – 2021-12-23 (×2): qty 45, 30d supply, fill #1

## 2021-10-16 MED ORDER — LAMOTRIGINE 25 MG PO TABS
ORAL_TABLET | ORAL | 0 refills | Status: DC
  Filled 2021-10-16: qty 45, 30d supply, fill #0

## 2021-10-17 ENCOUNTER — Ambulatory Visit: Payer: 59 | Admitting: Family Medicine

## 2021-10-22 ENCOUNTER — Other Ambulatory Visit: Payer: Self-pay

## 2021-10-23 ENCOUNTER — Encounter: Payer: 59 | Admitting: Certified Nurse Midwife

## 2021-11-26 ENCOUNTER — Other Ambulatory Visit: Payer: Self-pay

## 2021-11-26 DIAGNOSIS — F3289 Other specified depressive episodes: Secondary | ICD-10-CM | POA: Diagnosis not present

## 2021-11-26 DIAGNOSIS — F411 Generalized anxiety disorder: Secondary | ICD-10-CM | POA: Diagnosis not present

## 2021-11-26 DIAGNOSIS — F900 Attention-deficit hyperactivity disorder, predominantly inattentive type: Secondary | ICD-10-CM | POA: Diagnosis not present

## 2021-11-26 MED ORDER — AMPHETAMINE-DEXTROAMPHETAMINE 10 MG PO TABS
ORAL_TABLET | ORAL | 0 refills | Status: DC
  Filled 2021-11-26 – 2021-12-23 (×2): qty 30, 30d supply, fill #0

## 2021-11-26 MED ORDER — AMPHETAMINE-DEXTROAMPHET ER 30 MG PO CP24
30.0000 mg | ORAL_CAPSULE | Freq: Every day | ORAL | 0 refills | Status: AC
  Filled 2021-11-26 – 2021-12-23 (×2): qty 30, 30d supply, fill #0

## 2021-11-26 MED ORDER — LAMOTRIGINE 25 MG PO TABS
ORAL_TABLET | ORAL | 2 refills | Status: AC
  Filled 2021-11-26 – 2021-12-23 (×2): qty 60, 30d supply, fill #0

## 2021-12-02 ENCOUNTER — Other Ambulatory Visit: Payer: Self-pay

## 2021-12-17 ENCOUNTER — Other Ambulatory Visit: Payer: Self-pay

## 2021-12-23 ENCOUNTER — Other Ambulatory Visit: Payer: Self-pay

## 2021-12-24 ENCOUNTER — Other Ambulatory Visit: Payer: Self-pay

## 2022-03-11 ENCOUNTER — Other Ambulatory Visit: Payer: Self-pay

## 2022-03-11 MED ORDER — LAMOTRIGINE 25 MG PO TABS
50.0000 mg | ORAL_TABLET | Freq: Every day | ORAL | 2 refills | Status: AC
  Filled 2022-03-11: qty 60, 30d supply, fill #0

## 2022-03-11 MED ORDER — AMPHETAMINE-DEXTROAMPHET ER 30 MG PO CP24
30.0000 mg | ORAL_CAPSULE | Freq: Every day | ORAL | 0 refills | Status: AC
  Filled 2022-04-23 – 2022-06-13 (×2): qty 30, 30d supply, fill #0

## 2022-03-11 MED ORDER — AMPHETAMINE-DEXTROAMPHETAMINE 10 MG PO TABS
10.0000 mg | ORAL_TABLET | Freq: Every day | ORAL | 0 refills | Status: AC
  Filled 2022-03-11: qty 30, 30d supply, fill #0

## 2022-03-11 MED ORDER — SERTRALINE HCL 100 MG PO TABS
150.0000 mg | ORAL_TABLET | Freq: Every day | ORAL | 3 refills | Status: AC
  Filled 2022-03-11: qty 45, 30d supply, fill #0

## 2022-03-12 ENCOUNTER — Other Ambulatory Visit: Payer: Self-pay

## 2022-03-14 ENCOUNTER — Ambulatory Visit (INDEPENDENT_AMBULATORY_CARE_PROVIDER_SITE_OTHER): Payer: Commercial Managed Care - PPO | Admitting: Family Medicine

## 2022-03-14 ENCOUNTER — Encounter: Payer: Self-pay | Admitting: Family Medicine

## 2022-03-14 VITALS — BP 124/78 | HR 76 | Ht 63.0 in | Wt 182.0 lb

## 2022-03-14 DIAGNOSIS — R110 Nausea: Secondary | ICD-10-CM

## 2022-03-14 MED ORDER — ONDANSETRON HCL 4 MG PO TABS: 4.0000 mg | ORAL_TABLET | Freq: Three times a day (TID) | ORAL | 0 refills | Status: AC | PRN

## 2022-03-14 NOTE — Progress Notes (Signed)
Date:  03/14/2022   Name:  Katrina Campos   DOB:  1995/09/13   MRN:  [id number]   Chief Complaint: form filled out (Form stating she is physically fit to do a [place] trip)  Patient is a 27 year old female who presents for a medical evaluation physical exam. The patient reports the following problems: none. Health maintenance has been reviewed up to date.     Lab Results  Component Value Date   NA 135 05/13/2017   K 3.5 05/13/2017   CO2 20 (L) 05/13/2017   GLUCOSE 86 05/13/2017   BUN 13 05/13/2017   CREATININE 0.77 05/13/2017   CALCIUM 9.1 05/13/2017   GFRNONAA >60 05/13/2017   Lab Results  Component Value Date   CHOL 167 05/29/2017   HDL 37 (L) 05/29/2017   LDLCALC 120 (H) 05/29/2017   TRIG 50 05/29/2017   Lab Results  Component Value Date   TSH 1.08 04/28/2016   Lab Results  Component Value Date   HGBA1C 5.3 04/28/2016   Lab Results  Component Value Date   WBC 7.3 11/28/2019   HGB 13.7 11/28/2019   HCT 41.8 11/28/2019   MCV 91 11/28/2019   PLT 300 11/28/2019   Lab Results  Component Value Date   ALT 99 (H) 05/29/2017   AST 81 (H) 05/29/2017   ALKPHOS 66 05/29/2017   BILITOT 0.5 05/29/2017   No results found for: "25OHVITD2", "25OHVITD3", "VD25OH"   Review of Systems  Constitutional:  Negative for chills, diaphoresis, fatigue and fever.  HENT:  Negative for trouble swallowing.   Respiratory:  Negative for chest tightness, shortness of breath and wheezing.   Cardiovascular:  Negative for chest pain, palpitations and leg swelling.  Gastrointestinal:  Negative for blood in stool, constipation and diarrhea.  Genitourinary:  Negative for difficulty urinating.    Patient Active Problem List   Diagnosis Date Noted  . Hx of cardiac murmur 03/01/2019  . Anxiety 06/08/2016  . Palpitations 04/28/2016  . Obesity 12/11/2015    No Known Allergies  Past Surgical History:  Procedure Laterality Date  . TONSILLECTOMY N/A 01/21/2018   Procedure:  TONSILLECTOMY;  Surgeon: Margaretha Sheffield, MD;  Location: [place];  Service: ENT;  Laterality: N/A;  . tubes in ears      Social History   Tobacco Use  . Smoking status: Never  . Smokeless tobacco: Never  Vaping Use  . Vaping Use: Former  . Quit date: 08/02/2015  Substance Use Topics  . Alcohol use: Yes    Comment: socially - 1x/mo  . Drug use: No     Medication list has been reviewed and updated.  Current Meds  Medication Sig  . amphetamine-dextroamphetamine (ADDERALL XR) 10 MG 24 hr capsule Take one capsule by mouth in the morning and one capsule in the afternoon  . amphetamine-dextroamphetamine (ADDERALL XR) 30 MG 24 hr capsule Take one capsule daily  . amphetamine-dextroamphetamine (ADDERALL XR) 30 MG 24 hr capsule Take 1 capsule (30 mg total) by mouth daily.  Marland Kitchen amphetamine-dextroamphetamine (ADDERALL XR) 30 MG 24 hr capsule Take one capsule by mouth daily  . amphetamine-dextroamphetamine (ADDERALL XR) 30 MG 24 hr capsule Take one capsule daily. Fill on/after 07/17/22  . amphetamine-dextroamphetamine (ADDERALL) 10 MG tablet Take 1 tablet (10 mg total) by mouth daily in the afternoon.  . etonogestrel-ethinyl estradiol (NUVARING) 0.12-0.015 MG/24HR vaginal ring INSERT 1 RING VAGINALLY AS DIRECTED. REMOVE AFTER 3 WEEKS & WAIT 7 DAYS BEFORE INSERTING A NEW RING  .  lamoTRIgine (LAMICTAL) 25 MG tablet Take one tablet by mouth daily for two weeks, then take two tablets daily. Stop taking immediately if you develop a rash.  . lamoTRIgine (LAMICTAL) 25 MG tablet Take 2 tablets (50 mg total) by mouth daily. Stop taking immediately if you develop a rash.  . ondansetron (ZOFRAN) 4 MG tablet Take 1 tablet (4 mg total) by mouth every 8 (eight) hours as needed for nausea or vomiting.  . sertraline (ZOLOFT) 100 MG tablet Take one and a half tablets daily  . sertraline (ZOLOFT) 100 MG tablet Take one and a half tablets daily  . sertraline (ZOLOFT) 100 MG tablet Take 1.5 tablets (150 mg  total) by mouth daily.  . traZODone (DESYREL) 50 MG tablet Take one tablet one hour before bedtime       03/14/2022    8:51 AM 08/09/2020    8:27 AM 11/22/2019   11:21 AM 10/06/2019    2:59 PM  GAD 7 : Generalized Anxiety Score  Nervous, Anxious, on Edge 0 2 0 1  Control/stop worrying 0 0 0 0  Worry too much - different things 0 2 0 0  Trouble relaxing 0 2 0 1  Restless 0 0 0 0  Easily annoyed or irritable 0 2 0 1  Afraid - awful might happen 0 0 0 0  Total GAD 7 Score 0 8 0 3  Anxiety Difficulty Not difficult at all Not difficult at all  Not difficult at all       03/14/2022    8:51 AM 08/09/2020    8:26 AM 11/22/2019   11:20 AM  Depression screen PHQ 2/9  Decreased Interest 0 2 0  Down, Depressed, Hopeless 0 3 0  PHQ - 2 Score 0 5 0  Altered sleeping 0 2 0  Tired, decreased energy 0 2 0  Change in appetite 0 2 0  Feeling bad or failure about yourself  0 0 0  Trouble concentrating 0 3 0  Moving slowly or fidgety/restless 0 2 0  Suicidal thoughts 0 0 0  PHQ-9 Score 0 16 0  Difficult doing work/chores Not difficult at all Not difficult at all     BP Readings from Last 3 Encounters:  03/14/22 124/78  08/09/20 120/64  03/06/20 100/68    Physical Exam Vitals and nursing note reviewed. Exam conducted with a chaperone present.  Constitutional:      General: She is not in acute distress.    Appearance: She is not diaphoretic.  HENT:     Head: Normocephalic and atraumatic.     Right Ear: Tympanic membrane and external ear normal.     Left Ear: Tympanic membrane and external ear normal.     Nose: Nose normal.     Mouth/Throat:     Mouth: Mucous membranes are moist.  Eyes:     General:        Right eye: No discharge.        Left eye: No discharge.     Conjunctiva/sclera: Conjunctivae normal.     Pupils: Pupils are equal, round, and reactive to light.  Neck:     Thyroid: No thyroid mass or thyromegaly.     Vascular: No JVD.  Cardiovascular:     Rate and Rhythm: Normal  rate and regular rhythm.     Heart sounds: Normal heart sounds, S1 normal and S2 normal. No murmur heard.    No systolic murmur is present.     No diastolic murmur  is present.     No friction rub. No gallop. No S3 or S4 sounds.  Pulmonary:     Effort: Pulmonary effort is normal.     Breath sounds: Normal breath sounds. No decreased breath sounds, wheezing, rhonchi or rales.  Abdominal:     General: Bowel sounds are normal.     Palpations: Abdomen is soft. There is no hepatomegaly, splenomegaly or mass.     Tenderness: There is no abdominal tenderness. There is no guarding.  Musculoskeletal:        General: Normal range of motion.     Cervical back: Normal range of motion and neck supple.  Lymphadenopathy:     Cervical: No cervical adenopathy.     Right cervical: No superficial, deep or posterior cervical adenopathy.    Left cervical: No superficial, deep or posterior cervical adenopathy.  Skin:    General: Skin is warm and dry.  Neurological:     Mental Status: She is alert.     Deep Tendon Reflexes: Reflexes are normal and symmetric.     Reflex Scores:      Bicep reflexes are 2+ on the right side and 2+ on the left side.      Patellar reflexes are 2+ on the right side and 2+ on the left side.   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  There is no contraindications or medical concerns noted at this time.  Physical exam is normal. 1. Nausea Episodic.  Relatively controlled but given travel and I will would prefer to have some availability of Zofran if needed.  Will prescribe 4 mg every 8 hours as needed nausea.  Patient is also being given  doxycycline if traveler's diarrhea should occur.  Of also provided prophylactic malarial treatment with Malarone 250/100 mg as directed for the entirety of stay and region. - ondansetron (ZOFRAN) 4 MG tablet; Take 1 tablet (4 mg total) by mouth every 8 (eight) hours as needed for nausea or vomiting.  Dispense: 30 tablet; Refill: 0   Otilio Miu, MD

## 2022-03-17 ENCOUNTER — Other Ambulatory Visit: Payer: Self-pay

## 2022-04-23 ENCOUNTER — Telehealth: Payer: Self-pay

## 2022-04-23 ENCOUNTER — Other Ambulatory Visit: Payer: Self-pay

## 2022-04-23 ENCOUNTER — Other Ambulatory Visit: Payer: Self-pay | Admitting: Certified Nurse Midwife

## 2022-04-23 MED ORDER — ETONOGESTREL-ETHINYL ESTRADIOL 0.12-0.015 MG/24HR VA RING
VAGINAL_RING | VAGINAL | 2 refills | Status: AC
  Filled 2022-04-23: qty 1, 28d supply, fill #0

## 2022-04-23 NOTE — Telephone Encounter (Signed)
The patient is scheduled for 3/20 with Philip Aspen for annual exam. Please advise refill

## 2022-04-23 NOTE — Telephone Encounter (Signed)
Please call pt to schedule annual. Once scheduled, send msg back to me so I can send [facility] refill to [facility].

## 2022-04-24 ENCOUNTER — Other Ambulatory Visit: Payer: Self-pay

## 2022-04-25 ENCOUNTER — Other Ambulatory Visit: Payer: Self-pay

## 2022-04-28 ENCOUNTER — Other Ambulatory Visit: Payer: Self-pay

## 2022-04-29 ENCOUNTER — Other Ambulatory Visit: Payer: Self-pay

## 2022-05-01 ENCOUNTER — Other Ambulatory Visit: Payer: Self-pay

## 2022-05-09 ENCOUNTER — Other Ambulatory Visit: Payer: Self-pay

## 2022-05-21 ENCOUNTER — Other Ambulatory Visit: Payer: Self-pay

## 2022-05-21 ENCOUNTER — Encounter: Payer: Self-pay | Admitting: Certified Nurse Midwife

## 2022-05-21 ENCOUNTER — Ambulatory Visit (INDEPENDENT_AMBULATORY_CARE_PROVIDER_SITE_OTHER): Payer: Commercial Managed Care - PPO | Admitting: Certified Nurse Midwife

## 2022-05-21 ENCOUNTER — Other Ambulatory Visit (HOSPITAL_COMMUNITY)
Admission: RE | Admit: 2022-05-21 | Discharge: 2022-05-21 | Disposition: A | Discharge status: Home / Self Care | Payer: Commercial Managed Care - PPO | Source: Ambulatory Visit | Attending: Certified Nurse Midwife | Admitting: Certified Nurse Midwife

## 2022-05-21 VITALS — BP 117/75 | HR 80 | Ht 64.0 in | Wt 188.5 lb

## 2022-05-21 DIAGNOSIS — Z01419 Encounter for gynecological examination (general) (routine) without abnormal findings: Secondary | ICD-10-CM | POA: Diagnosis not present

## 2022-05-21 DIAGNOSIS — Z124 Encounter for screening for malignant neoplasm of cervix: Secondary | ICD-10-CM | POA: Diagnosis not present

## 2022-05-21 DIAGNOSIS — Z113 Encounter for screening for infections with a predominantly sexual mode of transmission: Secondary | ICD-10-CM | POA: Insufficient documentation

## 2022-05-21 DIAGNOSIS — F909 Attention-deficit hyperactivity disorder, unspecified type: Secondary | ICD-10-CM | POA: Insufficient documentation

## 2022-05-21 MED ORDER — NORETHINDRONE 0.35 MG PO TABS
1.0000 | ORAL_TABLET | Freq: Every day | ORAL | 11 refills | Status: AC
  Filled 2022-05-21: qty 84, 84d supply, fill #0
  Filled 2022-06-13: qty 28, 28d supply, fill #0

## 2022-05-21 NOTE — Progress Notes (Signed)
GYNECOLOGY ANNUAL PREVENTATIVE CARE ENCOUNTER NOTE  History:     Katrina Campos is a 27 y.o. G0P0000 female here for a routine annual gynecologic exam.  Current complaints: pt state she has ulcers over her breast and chest area. She has ahd for the past few years. She has seen dermatologist. They have tried antibiotics and did biopsy of cyst . She is asking about change BC to see if that helps   Denies abnormal vaginal bleeding, discharge, pelvic pain, problems with intercourse or other gynecologic concerns.     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 Contraception: oral progesterone-only contraceptive Last Pap: 03/01/2019. Results were: normal  Last mammogram: n/a .   Obstetric History OB History  Gravida Para Term Preterm AB Living  0 0 0 0 0 0  SAB IAB Ectopic Multiple Live Births  0 0 0 0 0    Past Medical History:  Diagnosis Date   ADHD    Fatty liver    GERD (gastroesophageal reflux disease)    Heart murmur    Born with one   IBS (irritable bowel syndrome) 01/2017    Past Surgical History:  Procedure Laterality Date   TONSILLECTOMY N/A 01/21/2018   Procedure: TONSILLECTOMY;  Surgeon: Margaretha Sheffield, MD;  Location: [facility];  Service: ENT;  Laterality: N/A;   tubes in ears      Current Outpatient Medications on File Prior to Visit  Medication Sig Dispense Refill   amphetamine-dextroamphetamine (ADDERALL XR) 10 MG 24 hr capsule Take one capsule by mouth in the morning and one capsule in the afternoon 60 capsule 0   amphetamine-dextroamphetamine (ADDERALL XR) 30 MG 24 hr capsule Take one capsule daily 30 capsule 0   amphetamine-dextroamphetamine (ADDERALL XR) 30 MG 24 hr capsule Take 1 capsule (30 mg total) by mouth daily. 30  capsule 0   amphetamine-dextroamphetamine (ADDERALL XR) 30 MG 24 hr capsule Take one capsule by mouth daily 30 capsule 0   amphetamine-dextroamphetamine (ADDERALL XR) 30 MG 24 hr capsule Take one capsule daily. Fill on/after 07/17/22 30 capsule 0   amphetamine-dextroamphetamine (ADDERALL) 10 MG tablet Take 1 tablet (10 mg total) by mouth daily in the afternoon. 30 tablet 0   etonogestrel-ethinyl estradiol (NUVARING) 0.12-0.015 MG/24HR vaginal ring INSERT 1 RING VAGINALLY AS DIRECTED. REMOVE AFTER 3 WEEKS & WAIT 7 DAYS BEFORE INSERTING A NEW RING 1 each 2   lamoTRIgine (LAMICTAL) 25 MG tablet Take one tablet by mouth daily for two weeks, then take two tablets daily. Stop taking immediately if you develop a rash. 60 tablet 2   lamoTRIgine (LAMICTAL) 25 MG tablet Take 2 tablets (50 mg total) by mouth daily. Stop taking immediately if you develop a rash. 60 tablet 2   ondansetron (ZOFRAN) 4 MG tablet Take 1 tablet (4 mg total) by mouth every 8 (eight) hours as needed for nausea or vomiting. 30 tablet 0   sertraline (ZOLOFT) 100 MG tablet Take one and a half tablets daily 45 tablet 3   sertraline (ZOLOFT) 100 MG tablet Take one and a half tablets daily 45 tablet 3   sertraline (ZOLOFT) 100 MG tablet Take 1.5 tablets (150 mg total) by mouth daily. 45 tablet 3   traZODone (DESYREL) 50 MG tablet Take one tablet one hour  before bedtime 30 tablet 3   No current facility-administered medications on file prior to visit.    No Known Allergies  Social History:  reports that she has never smoked. She has never used smokeless tobacco. She reports current alcohol use. She reports that she does not use drugs.  Family History  Problem Relation Age of Onset   Diabetes Paternal Grandfather    Hyperlipidemia Father    Diabetes Father    Heart disease Maternal Grandfather    Sudden death Maternal Grandfather     The following portions of the patient's history were reviewed and updated as appropriate: allergies,  current medications, past family history, past medical history, past social history, past surgical history and problem list.  Review of Systems Pertinent items noted in HPI and remainder of comprehensive ROS otherwise negative.  Physical Exam:  BP 117/75   Pulse 80   Ht 5\' 4"  (1.626 m)   Wt 188 lb 8 oz (85.5 kg)   LMP 04/30/2022 (Exact Date)   BMI 32.36 kg/m  CONSTITUTIONAL: Well-developed, well-nourished female in no acute distress.  HENT:  Normocephalic, atraumatic, External right and left ear normal. Oropharynx is clear and moist EYES: Conjunctivae and EOM are normal. Pupils are equal, round, and reactive to light. No scleral icterus.  NECK: Normal range of motion, supple, no masses.  Normal thyroid.  SKIN: Skin is warm and dry. No rash noted. Not diaphoretic. No erythema. No pallor. MUSCULOSKELETAL: Normal range of motion. No tenderness.  No cyanosis, clubbing, or edema.  2+ distal pulses. NEUROLOGIC: Alert and oriented to person, place, and time. Normal reflexes, muscle tone coordination.  PSYCHIATRIC: Normal mood and affect. Normal behavior. Normal judgment and thought content. CARDIOVASCULAR: Normal heart rate noted, regular rhythm RESPIRATORY: Clear to auscultation bilaterally. Effort and breath sounds normal, no problems with respiration noted. BREASTS: Symmetric in size. No masses, tenderness, skin changes, nipple drainage, or lymphadenopathy bilaterally.  ABDOMEN: Soft, no distention noted.  No tenderness, rebound or guarding.  PELVIC: Normal appearing external genitalia and urethral meatus; normal appearing vaginal mucosa and cervix.  No abnormal discharge noted.  Pap smear obtained. Contact bleeding Normal uterine size, no other palpable masses, no uterine or adnexal tenderness.  .   Assessment and Plan:    1. Women's annual routine gynecological examination    Pap: Will follow up results of pap smear and manage accordingly. Mammogram : n/a  Labs: vaginal swab   Refills: pop , changing from the Nuva ring  Referral: dermatology, pt has one in [place]  Routine preventative health maintenance measures emphasized. Please refer to After Visit Summary for other counseling recommendations.      Philip Aspen, [facility]  [facility]

## 2022-05-21 NOTE — Patient Instructions (Signed)

## 2022-05-22 LAB — CERVICOVAGINAL ANCILLARY ONLY
Chlamydia: NEGATIVE
Comment: NEGATIVE
Comment: NEGATIVE
Comment: NORMAL
Neisseria Gonorrhea: NEGATIVE
Trichomonas: NEGATIVE

## 2022-05-26 LAB — CYTOLOGY - PAP: Diagnosis: NEGATIVE

## 2022-06-10 ENCOUNTER — Other Ambulatory Visit: Payer: Self-pay

## 2022-06-13 ENCOUNTER — Other Ambulatory Visit: Payer: Self-pay

## 2022-06-16 ENCOUNTER — Other Ambulatory Visit: Payer: Self-pay

## 2022-06-26 ENCOUNTER — Other Ambulatory Visit: Payer: Self-pay
